# Patient Record
Sex: Female | Born: 1970
Health system: Southern US, Community
[De-identification: ages and names within clinical notes are randomized; demographics above are authoritative.]

## PROBLEM LIST (undated history)

## (undated) DIAGNOSIS — F329 Major depressive disorder, single episode, unspecified: Secondary | ICD-10-CM

## (undated) DIAGNOSIS — K2970 Gastritis, unspecified, without bleeding: Secondary | ICD-10-CM

## (undated) DIAGNOSIS — R011 Cardiac murmur, unspecified: Secondary | ICD-10-CM

## (undated) DIAGNOSIS — M722 Plantar fascial fibromatosis: Secondary | ICD-10-CM

## (undated) DIAGNOSIS — F410 Panic disorder [episodic paroxysmal anxiety] without agoraphobia: Secondary | ICD-10-CM

## (undated) DIAGNOSIS — G4733 Obstructive sleep apnea (adult) (pediatric): Secondary | ICD-10-CM

## (undated) DIAGNOSIS — N6019 Diffuse cystic mastopathy of unspecified breast: Secondary | ICD-10-CM

## (undated) DIAGNOSIS — K589 Irritable bowel syndrome without diarrhea: Secondary | ICD-10-CM

## (undated) DIAGNOSIS — E785 Hyperlipidemia, unspecified: Secondary | ICD-10-CM

## (undated) DIAGNOSIS — K219 Gastro-esophageal reflux disease without esophagitis: Secondary | ICD-10-CM

## (undated) DIAGNOSIS — I890 Lymphedema, not elsewhere classified: Secondary | ICD-10-CM

## (undated) DIAGNOSIS — R51 Headache: Secondary | ICD-10-CM

## (undated) DIAGNOSIS — F32A Depression, unspecified: Secondary | ICD-10-CM

## (undated) DIAGNOSIS — G43909 Migraine, unspecified, not intractable, without status migrainosus: Secondary | ICD-10-CM

## (undated) DIAGNOSIS — M797 Fibromyalgia: Secondary | ICD-10-CM

## (undated) DIAGNOSIS — F419 Anxiety disorder, unspecified: Secondary | ICD-10-CM

## (undated) DIAGNOSIS — M7730 Calcaneal spur, unspecified foot: Secondary | ICD-10-CM

## (undated) DIAGNOSIS — G2581 Restless legs syndrome: Secondary | ICD-10-CM

## (undated) DIAGNOSIS — J302 Other seasonal allergic rhinitis: Secondary | ICD-10-CM

## (undated) DIAGNOSIS — C50919 Malignant neoplasm of unspecified site of unspecified female breast: Secondary | ICD-10-CM

## (undated) DIAGNOSIS — F319 Bipolar disorder, unspecified: Secondary | ICD-10-CM

## (undated) HISTORY — DX: Fibromyalgia: M79.7

## (undated) HISTORY — DX: Headache: R51

## (undated) HISTORY — PX: OOPHORECTOMY: SHX86

## (undated) HISTORY — DX: Anxiety disorder, unspecified: F41.9

## (undated) HISTORY — DX: Gastritis, unspecified, without bleeding: K29.70

## (undated) HISTORY — DX: Migraine, unspecified, not intractable, without status migrainosus: G43.909

## (undated) HISTORY — DX: Other seasonal allergic rhinitis: J30.2

## (undated) HISTORY — DX: Obstructive sleep apnea (adult) (pediatric): G47.33

## (undated) HISTORY — DX: Depression, unspecified: F32.A

## (undated) HISTORY — DX: Hyperlipidemia, unspecified: E78.5

## (undated) HISTORY — DX: Diffuse cystic mastopathy of unspecified breast: N60.19

## (undated) HISTORY — DX: Calcaneal spur, unspecified foot: M77.30

## (undated) HISTORY — PX: MASTECTOMY: SHX3

## (undated) HISTORY — DX: Panic disorder (episodic paroxysmal anxiety): F41.0

## (undated) HISTORY — PX: BREAST LUMPECTOMY: SHX2

## (undated) HISTORY — DX: Restless legs syndrome: G25.81

## (undated) HISTORY — DX: Lymphedema, not elsewhere classified: I89.0

## (undated) HISTORY — DX: Irritable bowel syndrome, unspecified: K58.9

## (undated) HISTORY — PX: TONSILLECTOMY: SUR1361

## (undated) HISTORY — DX: Malignant neoplasm of unspecified site of unspecified female breast: C50.919

## (undated) HISTORY — DX: Bipolar disorder, unspecified: F31.9

## (undated) HISTORY — DX: Plantar fascial fibromatosis: M72.2

## (undated) HISTORY — DX: Major depressive disorder, single episode, unspecified: F32.9

## (undated) HISTORY — PX: KNEE SURGERY: SHX244

## (undated) HISTORY — DX: Cardiac murmur, unspecified: R01.1

## (undated) HISTORY — DX: Gastro-esophageal reflux disease without esophagitis: K21.9

---

## 2001-11-19 DIAGNOSIS — C50911 Malignant neoplasm of unspecified site of right female breast: Secondary | ICD-10-CM | POA: Insufficient documentation

## 2001-12-09 ENCOUNTER — Other Ambulatory Visit: Admission: RE | Admit: 2001-12-09 | Discharge: 2001-12-09 | Payer: Self-pay | Admitting: Vascular Surgery

## 2003-05-13 HISTORY — PX: LAPAROSCOPY: SHX197

## 2003-05-13 HISTORY — PX: ABDOMINAL HYSTERECTOMY: SHX81

## 2004-05-17 ENCOUNTER — Ambulatory Visit: Payer: Self-pay | Admitting: Oncology

## 2004-08-30 ENCOUNTER — Ambulatory Visit: Payer: Self-pay | Admitting: Oncology

## 2004-12-20 ENCOUNTER — Ambulatory Visit: Payer: Self-pay | Admitting: Oncology

## 2005-04-10 ENCOUNTER — Ambulatory Visit: Payer: Self-pay | Admitting: Oncology

## 2005-07-04 ENCOUNTER — Ambulatory Visit: Payer: Self-pay | Admitting: Oncology

## 2005-09-12 ENCOUNTER — Ambulatory Visit: Payer: Self-pay | Admitting: Oncology

## 2006-02-27 ENCOUNTER — Ambulatory Visit: Payer: Self-pay | Admitting: Oncology

## 2006-09-03 ENCOUNTER — Ambulatory Visit: Payer: Self-pay | Admitting: Oncology

## 2007-09-02 ENCOUNTER — Encounter: Payer: Self-pay | Admitting: Internal Medicine

## 2007-12-30 ENCOUNTER — Encounter (INDEPENDENT_AMBULATORY_CARE_PROVIDER_SITE_OTHER): Payer: Self-pay | Admitting: *Deleted

## 2008-01-26 ENCOUNTER — Encounter: Payer: Self-pay | Admitting: Internal Medicine

## 2008-03-15 ENCOUNTER — Ambulatory Visit: Payer: Self-pay | Admitting: Internal Medicine

## 2008-03-15 DIAGNOSIS — N390 Urinary tract infection, site not specified: Secondary | ICD-10-CM | POA: Insufficient documentation

## 2008-03-15 DIAGNOSIS — Z8659 Personal history of other mental and behavioral disorders: Secondary | ICD-10-CM | POA: Insufficient documentation

## 2008-03-15 DIAGNOSIS — F329 Major depressive disorder, single episode, unspecified: Secondary | ICD-10-CM | POA: Insufficient documentation

## 2008-03-15 DIAGNOSIS — F410 Panic disorder [episodic paroxysmal anxiety] without agoraphobia: Secondary | ICD-10-CM | POA: Insufficient documentation

## 2008-03-15 DIAGNOSIS — G43909 Migraine, unspecified, not intractable, without status migrainosus: Secondary | ICD-10-CM | POA: Insufficient documentation

## 2008-03-15 DIAGNOSIS — G4730 Sleep apnea, unspecified: Secondary | ICD-10-CM | POA: Insufficient documentation

## 2008-03-15 DIAGNOSIS — M722 Plantar fascial fibromatosis: Secondary | ICD-10-CM

## 2008-03-15 DIAGNOSIS — Z8679 Personal history of other diseases of the circulatory system: Secondary | ICD-10-CM | POA: Insufficient documentation

## 2008-03-15 DIAGNOSIS — R079 Chest pain, unspecified: Secondary | ICD-10-CM | POA: Insufficient documentation

## 2008-03-15 DIAGNOSIS — Z8669 Personal history of other diseases of the nervous system and sense organs: Secondary | ICD-10-CM | POA: Insufficient documentation

## 2008-03-15 DIAGNOSIS — Z8719 Personal history of other diseases of the digestive system: Secondary | ICD-10-CM | POA: Insufficient documentation

## 2008-03-15 DIAGNOSIS — J45909 Unspecified asthma, uncomplicated: Secondary | ICD-10-CM | POA: Insufficient documentation

## 2008-03-15 DIAGNOSIS — K219 Gastro-esophageal reflux disease without esophagitis: Secondary | ICD-10-CM

## 2008-03-15 DIAGNOSIS — F319 Bipolar disorder, unspecified: Secondary | ICD-10-CM | POA: Insufficient documentation

## 2008-03-15 DIAGNOSIS — E785 Hyperlipidemia, unspecified: Secondary | ICD-10-CM

## 2008-03-15 DIAGNOSIS — M7730 Calcaneal spur, unspecified foot: Secondary | ICD-10-CM

## 2008-03-15 DIAGNOSIS — I890 Lymphedema, not elsewhere classified: Secondary | ICD-10-CM | POA: Insufficient documentation

## 2008-03-15 DIAGNOSIS — Z87898 Personal history of other specified conditions: Secondary | ICD-10-CM | POA: Insufficient documentation

## 2008-03-15 LAB — CONVERTED CEMR LAB
Protein, U semiquant: NEGATIVE
RBC / HPF: NONE SEEN (ref <3)
Specific Gravity, Urine: 1.025
Urobilinogen, UA: 0.2

## 2008-03-16 ENCOUNTER — Encounter: Payer: Self-pay | Admitting: Internal Medicine

## 2008-03-17 ENCOUNTER — Telehealth (INDEPENDENT_AMBULATORY_CARE_PROVIDER_SITE_OTHER): Payer: Self-pay | Admitting: *Deleted

## 2008-03-17 LAB — CONVERTED CEMR LAB
ALT: 14 units/L (ref 0–35)
Basophils Absolute: 0 10*3/uL (ref 0.0–0.1)
Bilirubin, Direct: 0.1 mg/dL (ref 0.0–0.3)
CO2: 32 meq/L (ref 19–32)
Calcium: 9.6 mg/dL (ref 8.4–10.5)
Hemoglobin: 12.5 g/dL (ref 12.0–15.0)
Lymphocytes Relative: 42.9 % (ref 12.0–46.0)
MCHC: 33.9 g/dL (ref 30.0–36.0)
Neutro Abs: 3.5 10*3/uL (ref 1.4–7.7)
Neutrophils Relative %: 49.1 % (ref 43.0–77.0)
RDW: 13 % (ref 11.5–14.6)
Saturation Ratios: 11.1 % — ABNORMAL LOW (ref 20.0–50.0)
Sodium: 147 meq/L — ABNORMAL HIGH (ref 135–145)
Total Bilirubin: 0.6 mg/dL (ref 0.3–1.2)
Transferrin: 283.3 mg/dL (ref >=212.0)

## 2008-03-20 ENCOUNTER — Ambulatory Visit: Payer: Self-pay | Admitting: Gastroenterology

## 2008-03-20 DIAGNOSIS — K589 Irritable bowel syndrome without diarrhea: Secondary | ICD-10-CM

## 2008-03-20 DIAGNOSIS — R1310 Dysphagia, unspecified: Secondary | ICD-10-CM | POA: Insufficient documentation

## 2008-03-23 ENCOUNTER — Encounter: Payer: Self-pay | Admitting: Gastroenterology

## 2008-03-23 ENCOUNTER — Telehealth: Payer: Self-pay | Admitting: Gastroenterology

## 2008-03-24 ENCOUNTER — Ambulatory Visit: Payer: Self-pay | Admitting: Cardiology

## 2008-04-03 ENCOUNTER — Telehealth: Payer: Self-pay | Admitting: Gastroenterology

## 2008-04-07 ENCOUNTER — Telehealth: Payer: Self-pay | Admitting: Gastroenterology

## 2008-04-10 ENCOUNTER — Telehealth (INDEPENDENT_AMBULATORY_CARE_PROVIDER_SITE_OTHER): Payer: Self-pay | Admitting: *Deleted

## 2009-10-23 ENCOUNTER — Encounter: Payer: Self-pay | Admitting: Internal Medicine

## 2010-04-05 ENCOUNTER — Ambulatory Visit: Payer: Self-pay | Admitting: Cardiology

## 2010-06-11 NOTE — Assessment & Plan Note (Signed)
Summary: 40yr f/u chest pain pt refused appt. 03-29-10/sl   Visit Type:  Follow-up Primary Provider:  Vira Contreras  CC:  chest pain.  History of Present Illness: The presents with a 2 year followup. Since I last saw her she continues to have some chest discomfort. This sounds like chronic problem and she can't tell me whether it is worse. She feels it sporadically. The most recent episode was right upper chest. It happens at rest. She is not particularly active but doesn't notice it coming on with activity. There is no associated nausea vomiting or diaphoresis. She does have shortness of breath she says with mild to moderate activity. She is not describing PND or orthopnea. She is not describing palpitations, presyncope or syncope. As per my 2009 note she reports multiple stress tests in the past but none in the last few years.   Current Medications (verified): 1)  Crestor 20 Mg Tabs (Rosuvastatin Calcium) .... One Daily 2)  Nexium 40 Mg Cpdr (Esomeprazole Magnesium) .... Two Times A Day 3)  Promethazine Hcl 25 Mg Tabs (Promethazine Hcl) .... Prn 4)  Fexofenadine Hcl 180 Mg Tabs (Fexofenadine Hcl) .... Once Daily 5)  Xopenex 1.25 Mg/9ml Nebu (Levalbuterol Hcl) .... 1/2 Qid As Needed 6)  Proventil Hfa 108 (90 Base) Mcg/act Aers (Albuterol Sulfate) .... 2 Puffs As Needed 7)  Gabapentin 600 Mg Tabs (Gabapentin) .... 2 Tablets 4 Times A Day 8)  Tramadol-Acetaminophen 37.5-325 Mg Tabs (Tramadol-Acetaminophen) .... One Tab By Mouth As Needed 9)  Cymbalta 60 Mg Cpep (Duloxetine Hcl) .... Daily 10)  Qvar 80 Mcg/act Aers (Beclomethasone Dipropionate) .... 2 Inhalations Twice A Day 11)  Azelastine Hcl 137 Mcg/spray Soln (Azelastine Hcl) .... 2 Sprays [place] Twice A Day 12)  Lidocaine Hcl 4 % Soln (Lidocaine Hcl) .... As Needed For Migraine 13)  Pramipexole Dihydrochloride 0.125 Mg Tabs (Pramipexole Dihydrochloride) .... One A Bedtime  Allergies (verified): 1)  ! Penicillin 2)  ! Codeine 3)  !  Sulfa 4)  ! Oxycodone Hcl 5)  ! Diazepam 6)  ! * Norethindrone 7)  ! Hydrocodone 8)  ! Lortab 9)  ! * Topamax 10)  ! Requip 11)  ! Avelox 12)  ! Vicodin 13)  ! Levaquin  Past History:  Past Medical History: BREAST CANCER  2003  LYMPHEDEMA, RIGHT ARM Bipolar D/O Dx aprox 2006 HYPERLIPIDEMIA   GERD   ASTHMA   OSA-- has a CPAP RLS IBS PLANTAR FASCIITIS, RIGHT, and  HEEL SPUR  FIBROCYSTIC BREAST DISEASE, HX OF (ICD-V13.9) ANXIETY DISORDER , PANICK ATTACKS and DEPRESSION  MIGRAINE HEADACHE (ICD-346.90) HEART MURMUR, HX OF (ICD-V12.50)     Past Surgical History: Knee surgery (1610 & 1990) Tonsillectomy (1991) Lumpectomy (12/06/2001) (rt breast) Mastectomy (12/27/2001) (rt breast) Lumpectomy (08/08/2002) (left breast) Hysterectomy (03/04/2004) Oophorectomy (03/04/2004) Laparoscope  Review of Systems       As stated in the HPI and negative for all other systems.   Vital Signs:  Patient profile:   40 year old female Height:      61 inches Weight:      205 pounds BMI:     38.87 BP sitting:   130 / 80  (left arm) Cuff size:   large  Vitals Entered By: Deliah Goody, RN (April 05, 2010 11:27 AM)  Physical Exam  General:  Well developed, well nourished, in no acute distress. Head:  normocephalic and atraumatic Eyes:  PERRLA/EOM intact; conjunctiva and lids normal. Neck:  Neck supple, no JVD. No masses, thyromegaly or abnormal  cervical nodes. Chest Wall:  CAD status post right mastectomy Lungs:  Clear bilaterally to auscultation and percussion. Abdomen:  soft, no distention, no masses, no guarding, and no rigidity.  mild generalize tenderness Msk:  Back normal, normal gait. Muscle strength and tone normal. Extremities:  no pretibial edema bilaterally  Neurologic:  Alert and oriented x 3. Skin:  Intact without lesions or rashes. Cervical Nodes:  no significant adenopathy Inguinal Nodes:  no significant adenopathy Psych:  Normal affect.   Detailed  Cardiovascular Exam  Neck    Carotids: Carotids full and equal bilaterally without bruits.      Neck Veins: Normal, no JVD.    Heart    Inspection: no deformities or lifts noted.      Palpation: normal PMI with no thrills palpable.      Auscultation: regular rate and rhythm, S1, S2 without murmurs, rubs, gallops, or clicks.    Vascular    Abdominal Aorta: no palpable masses, pulsations, or audible bruits.      Femoral Pulses: normal femoral pulses bilaterally.      Pedal Pulses: normal pedal pulses bilaterally.      Radial Pulses: normal radial pulses bilaterally.      Peripheral Circulation: no clubbing, cyanosis, or edema noted with normal capillary refill.     Impression & Recommendations:  Problem # 1:  CHEST PAIN (ICD-786.50) She has atypical symptoms. I think the pretest probability of obstructive coronary disease is somewhat low. Exercise treadmill testing is reasonable as screening for this patient. Orders: EKG w/ Interpretation (93000) Treadmill (Treadmill)  Problem # 2:  HYPERLIPIDEMIA (ICD-272.4) She is having is followed closely by her primary physician. She reports some muscle cramping with Crestor.  Problem # 3:  HEART MURMUR, HX OF (ICD-V12.50) I do not appreciate a heart murmur and no further evaluation is suggested.  Problem # 4:  OBESITY, UNSPECIFIED (ICD-278.00) Her BMI puts her in an obese range.  She needs weight loss with diet and exercise and we discussed this.  Patient Instructions: 1)  Your physician recommends that you schedule a follow-up appointment at the time of your treadmill 2)  Your physician recommends that you continue on your current medications as directed. Please refer to the Current Medication list given to you today. 3)  Your physician has requested that you have an exercise tolerance test.  For further information please visit [email or web address]  Please also follow instruction sheet, as given.

## 2010-06-11 NOTE — Letter (Signed)
Summary: [facility]  [facility]   Imported By: Lanelle Bal 11/14/2009 14:59:22  _____________________________________________________________________  External Attachment:    Type:   Image     Comment:   External Document

## 2010-09-16 ENCOUNTER — Encounter: Payer: Self-pay | Admitting: Physician Assistant

## 2010-09-24 NOTE — Assessment & Plan Note (Signed)
[facility]                            CARDIOLOGY OFFICE NOTE   Kingsley Plan [name]                     MRN:          [id number]  DATE:03/24/2008                            DOB:          Aug 22, 1970    PRIMARY:  [name], MD   REASON FOR CONSULTATION:  Evaluated the patient with chest pain.   HISTORY OF PRESENT ILLNESS:  The patient is a pleasant 40 year old white  female with a long history of chest discomfort.  She says she has had  this for years.  She has had previous cardiac workups to include stress  testing x2.  She has also had echocardiograms.  She has had monitors.  All of this had been done in [place].  She thinks the last stress test  was about 2-3 years ago.  She does describe an adenosine Cardiolite.  Apparently, this was all negative.  She is seeing Dr. Drue Novel now for  evaluation of multiple complaints.  Among these is the chest discomfort.  It has been more intense recently.  She describes a tightness.  It can  happen at rest.  It can happen with walking.  She notes if she walks a  hill close to home she will get this discomfort.  She can only make it  about half way where she has to stop.  The pain can be 5/10 in  intensity.  It goes away with rest.  She may have this at rest as well.  She gets nauseated.  She says it is the same pain with the same  intensity and perhaps more frequent.  She has had this discomfort for  years and this is why she has had the previous workups.  She  occasionally has palpitations.  She has some lymphedema in her right arm  and some chronic discomfort there.  She has occasional palpitations at  night.  She has no presyncope or syncope.  She does not describe any PND  or orthopnea.  She said she does have wheezing, she says she also have  sleep apnea and uses a CPAP at night.   PAST MEDICAL HISTORY:  Hyperlipidemia, irritable bowel syndrome, sleep  apnea, bipolar disorder (she says she came off her  medicines and she is  going to church to try to treat this), reflux, asthma, plantar  fasciitis, heel spur, fibrocystic breast disease, lymphedema, panic  attack/anxiety disorder, restless leg syndrome, migraine headaches.   PAST SURGICAL HISTORY:  Knee surgery, tonsillectomy, lumpectomy,  mastectomy, hysterectomy.   ALLERGIES INTOLERANCES:  PENICILLIN, CODEINE, SULFA, OXYCODONE,  DIAZEPAM, NORETHINDRONE, HYDROCODONE, LORTAB, TOPAMAX, REQUIP, AVELOX,  VICODIN, and LEVAQUIN.   MEDICATIONS:  1. Detrol LA 4 mg daily.  2. Crestor 20 mg daily.  3. Darvocet.  4. Rhinocort.  5. Nexium 40 mg two times a day.  6. Fish oil 3  times a day.  7. Viactiv 500 mg 2 times a day.  8. Promethazine as needed.  9. Foradil aerolizer.  10.Allegra.  11.Xopenex.  12.Proventil.  13.Diphenoxylate.  14.Imitrex.  15.Gabapentin.  16.Verapamil 120 mg daily.  17.Cysteine.  18.Robinul Forte.   SOCIAL  HISTORY:  The patient is disabled.  She is not married.   FAMILY HISTORY:  Somewhat vague.  She knows her dad is alive at 68 and  had stents and an open heart surgery.  She does not know at what age.  Says she has a brother with congestive heart failure at age 33.   REVIEW OF SYSTEMS:  With multiple positive findings including headaches,  dizziness, tinnitus, dyspnea, chest pain, palpitations, questionable  hemoptysis, asthma, constipation, nausea, urinary frequency, swelling in  her whole body, pain from a whiplash, rhinitis.  Otherwise negative for  other systems.   PHYSICAL EXAMINATION:  VITAL SIGNS:  The patient is in no distress.  Blood pressure 124/86, heart 76 and regular, weight 203 pounds, body  mass index 38.  HEENT:  Eyelids unremarkable, pupils equal, round, and reactive to  light, fundi not visualized, oral mucosa unremarkable.  NECK:  No jugular venous distention at 45 degrees, carotid upstroke  brisk and symmetric, no bruits, no thyromegaly.  LYMPHATICS:  No cervical, axillary, inguinal  adenopathy.  LUNGS:  Clear to auscultation bilaterally.  BACK:  No costovertebral angle tenderness.  CHEST:  Unremarkable.  HEART:  PMI not displaced or sustained, S1 and S2 within normal limits,  no S3, no S4, no clicks, no rubs, no murmurs.  ABDOMEN:  Obese, positive bowel sounds normal in frequency and pitch, no  bruits, no rebound, no guarding, or midline pulsatile mass, no  hepatomegaly, no splenomegaly.  SKIN:  No rashes, no nodules.  EXTREMITIES:  2+ pulses throughout, no edema, no cyanosis, or clubbing.  NEURO:  Oriented to person, place, and time.  Cranial nerves II through  XII are grossly intact, motor grossly intact.   EKG sinus rhythm, rate 76, axis within normal limits, intervals within  normal limits, no acute ST-T wave changes.   ASSESSMENT AND PLAN:  1. Chest.  The patient's chest comfort is chronic.  She has had an      extensive workup apparently for the same symptoms.  At this point,      I would not strongly suspect obstructive coronary disease.  Her      physical exam does not suggest this.  Her EKG does not suggest      this.  Her symptoms are atypical predominately and again unchanged      from previous workups.  I do think she has probable      gastrointestinal complaints and she is due to have an EGD with Dr.      Arlyce Dice in the next couple of weeks.  I would pursue this.  I would      like to review the old cardiac workup.  Dr. Drue Novel has requested these      charts and I have requested them from him.  If she continues to      have complaints and there is no clear GI etiology, I would be happy      to see her back to consider further cardiac testing.  In the      meantime, she needs primary risk reduction.  2. Obesity.  She understands the need to lose weight with diet and      exercise.  3. Bipolar disorder.  She has come off of her medicines apparently and      I have encouraged her to follow with her doctors as this is unsafe.      She currently however is  not depressed or particularly manic at  this appointment.  4. Asthma per her primary care doctors.  She remains on medicines as      listed.  5. Dyslipidemia.  I have given her written note to get a lipid profile      and liver enzymes done.  I would be happy to review this.     Rollene Rotunda, MD, [facility]  Electronically Signed    JH/MedQ  DD: 03/24/2008  DT: 03/25/2008  Job #: [id number]   cc:   [name], MD

## 2010-09-27 ENCOUNTER — Telehealth: Payer: Self-pay | Admitting: Cardiology

## 2010-09-27 NOTE — Telephone Encounter (Signed)
Pt wants to set up stress test--nt

## 2010-09-27 NOTE — Telephone Encounter (Signed)
Per pt calling, wants to set up stress test.

## 2010-09-29 NOTE — Telephone Encounter (Signed)
Ok to set up ETT with me or with [employer].

## 2010-10-01 ENCOUNTER — Encounter: Payer: Self-pay | Admitting: Physician Assistant

## 2010-10-01 ENCOUNTER — Encounter: Payer: Self-pay | Admitting: *Deleted

## 2010-10-02 ENCOUNTER — Encounter: Payer: Self-pay | Admitting: Physician Assistant

## 2010-10-02 ENCOUNTER — Ambulatory Visit (INDEPENDENT_AMBULATORY_CARE_PROVIDER_SITE_OTHER): Payer: Medicare Other | Admitting: Physician Assistant

## 2010-10-02 VITALS — BP 122/82 | HR 81 | Ht 61.0 in | Wt 198.4 lb

## 2010-10-02 DIAGNOSIS — R079 Chest pain, unspecified: Secondary | ICD-10-CM

## 2010-10-02 DIAGNOSIS — R0602 Shortness of breath: Secondary | ICD-10-CM

## 2010-10-02 NOTE — Progress Notes (Signed)
History of Present Illness: Primary Cardiologist:  Dr. Rollene Rotunda  Danielle Contreras is a 40 y.o. female with a h/o some chest discomfort.  In review of her chart, she reports multiple stress tests in the past but none in the last few years.  She saw Dr. Antoine Poche in 03/2010 and he recommended she undergo an ETT.  She canceled this due to knee pain but never rescheduled a Myoview.    She presents today with complaints of chest discomfort.  She's been evaluated for this in the past.  This is a tightness in her left chest that seems worse over the last one to 2 months.  She had a right mastectomy several years ago.  She had a mammogram recently that was somewhat painful.  She also had an MRI done of her left breast.  She developed worsening chest symptoms after this.  She does note dyspnea with exertion.  She really describes NYHA class II symptoms.  She denies orthopnea.  She denies edema.  She does sleep with a CPAP for her sleep apnea.  She saw her allergy and asthma doctor recently and he felt that she probably had musculoskeletal chest pain.  She had recent lab work done with her PCP which demonstrated hypokalemia with a potassium of 3.3.  LFTs were normal except for an alkaline phosphatase of 148. Her hemoglobin was normal at 13.6.  She has chest discomfort at times when she walks that is associated with shortness of breath.  She also notes chest discomfort if she changes positioning or moves her left arm.  She can experience the symptoms at rest.  Actually, her chest symptoms have been fairly constant over the last month or 2.  It seems to be worse recently.  She does feel lightheaded at times.  She denies syncope.  She also notes a hard heartbeat at times but no tachy-palpitations.  She denies any recent long trips.  Her asthma seems to be well-controlled.  She denies significant wheezing or cough.  She does take her proton pump inhibitor when she remembers it.  Past Medical History  Diagnosis Date    . Breast cancer   . Lymphedema   . Bipolar 1 disorder   . Hyperlipidemia   . GERD (gastroesophageal reflux disease)   . Asthma   . OSA (obstructive sleep apnea)   . RLS (restless legs syndrome)   . IBS (irritable bowel syndrome)   . Plantar fasciitis   . Breast fibrocystic disorder   . Headache   . Heart murmur     Current Outpatient Prescriptions  Medication Sig Dispense Refill  . albuterol (PROVENTIL) (5 MG/ML) 0.5% nebulizer solution Take 2.5 mg by nebulization every 6 (six) hours as needed.        Marland Kitchen albuterol (PROVENTIL,VENTOLIN) 90 MCG/ACT inhaler Inhale 2 puffs into the lungs every 6 (six) hours as needed.        Marland Kitchen azelastine (ASTELIN) 137 MCG/SPRAY nasal spray 1 spray by Nasal route 2 (two) times daily. Use in each nostril as directed       . beclomethasone (QVAR) 80 MCG/ACT inhaler Inhale 1 puff into the lungs as needed.        . DULoxetine (CYMBALTA) 60 MG capsule Take 60 mg by mouth daily.        Marland Kitchen esomeprazole (NEXIUM) 40 MG capsule Take 40 mg by mouth 2 (two) times daily.        . fexofenadine (ALLEGRA) 180 MG tablet Take 180 mg by mouth  daily.        . gabapentin (NEURONTIN) 600 MG tablet 2 tabs po 4 times per day       . pramipexole (MIRAPEX) 0.125 MG tablet Take 0.125 mg by mouth at bedtime.        . promethazine (PHENERGAN) 25 MG tablet Take 25 mg by mouth every 6 (six) hours as needed.        . rosuvastatin (CRESTOR) 20 MG tablet Take 20 mg by mouth daily.        . traMADol (ULTRAM) 50 MG tablet Take 50 mg by mouth every 6 (six) hours as needed.        Marland Kitchen DISCONTD: esomeprazole (NEXIUM) 40 MG capsule Take 40 mg by mouth daily before breakfast.        . DISCONTD: levalbuterol (XOPENEX) 1.25 MG/0.5ML nebulizer solution Take 1 ampule by nebulization every 4 (four) hours as needed.          Allergies: Allergies  Allergen Reactions  . Avelox (Moxifloxacin Hcl In Nacl)   . Codeine   . Diazepam   . Hydrocodone   . Hydrocodone-Acetaminophen   . Levaquin   .  Levofloxacin   . Moxifloxacin   . Oxycodone Hcl   . Penicillins   . Ropinirole Hydrochloride   . Sulfonamide Derivatives   . Topiramate    History  Substance Use Topics  . Smoking status: Never Smoker   . Smokeless tobacco: Not on file  . Alcohol Use: No    Family History  Problem Relation Age of Onset  . Coronary artery disease    . Stroke    . Diabetes    . Hypertension    . Colon cancer    . Breast cancer    . Diabetes Father   . Heart failure Brother     ROS:  See history of present illness.  She reports frequent migraines.  She denies fevers, chills, cough, melena, hematochezia.  All her systems reviewed and negative.  Vital Signs: BP 144/112  Pulse 66  Ht 5\' 1"  (1.549 m)  Wt 198 lb 6.4 oz (89.994 kg)  BMI 37.49 kg/m2  PHYSICAL EXAM: Well nourished, well developed, in no acute distress HEENT: normal Neck: no JVD Vascular: No carotid bruits Endocrine: No thyromegaly Cardiac:  normal S1, S2; RRR; no murmur Lungs:  clear to auscultation bilaterally, no wheezing, rhonchi or rales Abd: soft, nontender, no hepatomegaly Ext: no edema Skin: warm and dry Neuro:  CNs 2-12 intact, no focal abnormalities noted Psych: Somewhat tearful at times  EKG:  Sinus rhythm, heart rate 66, normal axis, nonspecific ST-T wave changes  ASSESSMENT AND PLAN:

## 2010-10-02 NOTE — Assessment & Plan Note (Addendum)
Etiology of her symptoms are somewhat unclear.  She never did have her stress test.  I recommended that she proceed with a Lexiscan Myoview to rule out ischemic heart disease.  I've also recommended that she proceed with a 2-D echocardiogram to further assess her shortness of breath.  She did have chemotherapy for breast cancer.  I will also add a BNP to blood work that she has scheduled with her primary care provider.  This will also include a TSH.  We'll bring her back in follow up with Dr. Antoine Poche in 2 weeks.  Other etiologies for her symptoms include musculoskeletal, gastrointestinal.  I have asked her to take her Nexium every day.    Of note, her BP was hard to hear.  We checked orthostatics and her pressures were ok.  I repeated it and it was 132 systolic.  This is consistent with what she gets at her PCP's office.    We could consider a holter for her palpitations.  But, proceed with an echo and myoview for now with close follow up.

## 2010-10-02 NOTE — Patient Instructions (Addendum)
Your physician has requested that you have a lexiscan Myoview 786.50, 786.05 CP AND SOB. For further information please visit [email or web address] Please follow instruction sheet, as given.  Your physician has requested that you have an echocardiogram 786.05, 786.50. Echocardiography is a painless test that uses sound waves to create images of your heart. It provides your doctor with information about the size and shape of your heart and how well your heart's chambers and valves are working. This procedure takes approximately one hour. There are no restrictions for this procedure.  A PRESCRIPTION HAS BEEN GIVEN TO YOU TODAY TO HAVE LAB WORK DONE WITH [facility] FOR A BNP 786.50, 786.05 CP AND SOB   Your physician recommends that you schedule a follow-up appointment in: 2 WEEKS WITH DR. HOCHREIN, IF NOT AVAILABLE THEN TO SCHEDULE WITH SCOTT WEAVER, PA DAY [facility] IS IN THE OFFICE

## 2010-10-04 ENCOUNTER — Encounter: Payer: Self-pay | Admitting: Physician Assistant

## 2010-10-15 ENCOUNTER — Other Ambulatory Visit (HOSPITAL_COMMUNITY): Payer: Medicare Other | Admitting: Radiology

## 2010-10-16 ENCOUNTER — Ambulatory Visit (HOSPITAL_COMMUNITY): Payer: Medicare Other | Attending: Cardiology | Admitting: Radiology

## 2010-10-16 ENCOUNTER — Ambulatory Visit (INDEPENDENT_AMBULATORY_CARE_PROVIDER_SITE_OTHER): Payer: Medicare Other | Admitting: Physician Assistant

## 2010-10-16 ENCOUNTER — Ambulatory Visit (HOSPITAL_BASED_OUTPATIENT_CLINIC_OR_DEPARTMENT_OTHER): Payer: Medicare Other | Admitting: Radiology

## 2010-10-16 ENCOUNTER — Encounter: Payer: Self-pay | Admitting: Physician Assistant

## 2010-10-16 VITALS — BP 126/82 | HR 71 | Ht 61.0 in | Wt 195.0 lb

## 2010-10-16 DIAGNOSIS — R002 Palpitations: Secondary | ICD-10-CM | POA: Insufficient documentation

## 2010-10-16 DIAGNOSIS — K219 Gastro-esophageal reflux disease without esophagitis: Secondary | ICD-10-CM

## 2010-10-16 DIAGNOSIS — R0602 Shortness of breath: Secondary | ICD-10-CM

## 2010-10-16 DIAGNOSIS — R0789 Other chest pain: Secondary | ICD-10-CM

## 2010-10-16 DIAGNOSIS — R079 Chest pain, unspecified: Secondary | ICD-10-CM

## 2010-10-16 DIAGNOSIS — R072 Precordial pain: Secondary | ICD-10-CM

## 2010-10-16 DIAGNOSIS — J45909 Unspecified asthma, uncomplicated: Secondary | ICD-10-CM

## 2010-10-16 MED ORDER — TECHNETIUM TC 99M TETROFOSMIN IV KIT
33.0000 | PACK | Freq: Once | INTRAVENOUS | Status: AC | PRN
  Administered 2010-10-16: 33 via INTRAVENOUS

## 2010-10-16 MED ORDER — REGADENOSON 0.4 MG/5ML IV SOLN
0.4000 mg | Freq: Once | INTRAVENOUS | Status: AC
  Administered 2010-10-16: 0.4 mg via INTRAVENOUS

## 2010-10-16 MED ORDER — TECHNETIUM TC 99M TETROFOSMIN IV KIT
11.0000 | PACK | Freq: Once | INTRAVENOUS | Status: AC | PRN
  Administered 2010-10-16: 11 via INTRAVENOUS

## 2010-10-16 NOTE — Assessment & Plan Note (Signed)
Continue Nexium.  Follow up with gastroenterology.

## 2010-10-16 NOTE — Assessment & Plan Note (Signed)
Continue current medicines and follow up with pulmonology.

## 2010-10-16 NOTE — Assessment & Plan Note (Signed)
With a normal BNP, echocardiogram and Myoview, this appears to be noncardiac.  I suspect she has a combination of musculoskeletal chest pain, asthma and acid reflux contributing to her symptoms.  She has seen her pulmonologist recently.  She has not seen gastroenterology in about 3 years.  I should note, her symptoms seem to have improved since he started taking Nexium on a daily basis.  She is taking this twice a day.  I have recommended that she follow up with gastroenterology.

## 2010-10-16 NOTE — Patient Instructions (Signed)
Your physician recommends that you schedule a follow-up appointment in: 2-3 MONTHS WITH DR. HOCHREIN AS PER SCOTT WEAVER, PA-C.  You have been referred to GASTROENTEROLOGY WITH DR. KAPLAN FOR GERD AS PER SCOTT WEAVER, PA-C.  Your physician has recommended that you wear an event monitor 785.1 PALPITATIONS. Event monitors are medical devices that record the heart's electrical activity. Doctors most often [place] these monitors to diagnose arrhythmias. Arrhythmias are problems with the speed or rhythm of the heartbeat. The monitor is a small, portable device. You can wear one while you do your normal daily activities. This is usually used to diagnose what is causing palpitations/syncope (passing out).

## 2010-10-16 NOTE — Progress Notes (Addendum)
[facility] SITE 3 NUCLEAR MED [address] [place] [address] [phone]  Cardiology Nuclear Med Study  Danielle Contreras is a 40 y.o. female [id number] January 07, 1971   Nuclear Med Background Indication for Stress Test:  Evaluation for Ischemia History: Stress Echo: > 17yrs ago: Ok per patient, History of Chemo, and Echo today Cardiac Risk Factors: Family History - CAD, Hypertension and Lipids  Symptoms:  Chest Pain, Chest Tightness with Exertion (last date of chest discomfort 2-3 days ago), DOE, Light-Headedness, Nausea, Near Syncope, Palpitations and Rapid HR   Nuclear Pre-Procedure Caffeine/Decaff Intake:  None NPO After: 1:00am   Lungs:  Clear IV 0.9% NS with Angio Cath:  24g  IV Site: L Hand  IV Started by:  Irean Hong, RN  Chest Size (in):  42 Cup Size: D  Height: 5\' 1"  (1.549 m)  Weight:  195 lb (88.451 kg)  BMI:  Body mass index is 36.84 kg/(m^2). Tech Comments:  N/A    Nuclear Med Study 1 or 2 day study: 1 day  Stress Test Type:  Treadmill/Lexiscan  Reading MD: Charlton Haws, MD  Order Authorizing Provider:  Rollene Rotunda, MD  Resting Radionuclide: Technetium 65m Tetrofosmin  Resting Radionuclide Dose: 11 mCi   Stress Radionuclide:  Technetium 49m Tetrofosmin  Stress Radionuclide Dose: 33 mCi           Stress Protocol Rest HR: 64 Stress HR: 150  Rest BP: 125/83 Stress BP: 154/72  Exercise Time (min): 2:00 METS: n/a   Predicted Max HR: 179 bpm % Max HR: 83.8 bpm Rate Pressure Product: 21308   Dose of Adenosine (mg):  n/a Dose of Lexiscan: 0.4 mg  Dose of Atropine (mg): n/a Dose of Dobutamine: n/a mcg/kg/min (at max HR)  Stress Test Technologist: Irean Hong, RN  Nuclear Technologist:  Doyne Keel, CNMT     Rest Procedure:  Myocardial perfusion imaging was performed at rest 45 minutes following the intravenous administration of Technetium 10m Tetrofosmin. Rest ECG: NSR with sinus arrhythmia  Stress Procedure:  The patient received  IV Lexiscan 0.4 mg over 15-seconds with concurrent low level exercise. Technetium 29m Tetrofosmin was injected at 30-seconds while the patient continued walking. There were nonspecific changes with Lexiscan.  Quantitative spect images were obtained after a 45-minute delay. Stress ECG: No significant change from baseline ECG  QPS Raw Data Images:  Normal; no motion artifact; normal heart/lung ratio. Stress Images:  Normal homogeneous uptake in all areas of the myocardium. Rest Images:  Normal homogeneous uptake in all areas of the myocardium. Subtraction (SDS):  Normal Transient Ischemic Dilatation (Normal <1.22):  0.87 Lung/Heart Ratio (Normal <0.45):  0.40  Quantitative Gated Spect Images QGS EDV:  62 ml QGS ESV:  10 ml QGS cine images:  NL LV Function; NL Wall Motion QGS EF: 84%  Impression Exercise Capacity:  Lexiscan with low level exercise. BP Response:  Normal blood pressure response. Clinical Symptoms:  No chest pain. ECG Impression:  No significant ST segment change suggestive of ischemia. Comparison with Prior Nuclear Study: No previous nuclear study performed  Overall Impression:  Normal stress nuclear study.   Charlton Haws  Negative stress perfusion study.  Rollene Rotunda

## 2010-10-16 NOTE — Progress Notes (Signed)
History of Present Illness: Primary Cardiologist:  Dr. Rollene Rotunda  Danielle Contreras is a 40 y.o. female with a h/o some chest discomfort.  In review of her chart, she reports multiple stress tests in the past but none in the last few years.  She saw Dr. Antoine Poche in 03/2010 and he recommended she undergo an ETT.  She canceled this due to knee pain but never rescheduled a Myoview.    I saw her 2 weeks ago with complaints of chest discomfort.  She has been evaluated for this in the past.  This was a tightness in her left chest that seemed worse over the last one to 2 months.  She had a right mastectomy several years ago and had had a mammogram recently that was somewhat painful.  She also had an MRI done of her left breast.  She developed worsening chest symptoms after this.  She also noted dyspnea with exertion.  She really describes NYHA class II symptoms.  She denied orthopnea.  She denied edema.  She does sleep with a CPAP for her sleep apnea.  She had seen her allergy and asthma doctor recently and he felt that she probably had musculoskeletal chest pain.  She reported chest discomfort at times when she walks that was associated with shortness of breath.  She also noted chest discomfort if she changed positioning or moved her left arm.  She noted the symptoms at rest.  Actually, her chest symptoms had been fairly constant over the last month or 2.  She noted some lightheadedness and some palpitations.  I had her go back on her PPI on a regular basis.  She was only taking it PRN.  I set her up for an echo and a myoview.  I just got the echo results.  Her EF is normal and she has normal diastolic function.  No valvular abnormalities.  She had a BNP done 2 weeks ago that was normal.  Her Myoview has not been read yet.  But, I reviewed the images with Dr. Antoine Poche.  There does not appear to be any ischemia.  Her chest symptoms are better.  She continues to have palpitations.  She continues to have  lightheadedness associated with this.  She denies syncope.  Past Medical History  Diagnosis Date  . Breast cancer   . Lymphedema   . Bipolar 1 disorder   . Hyperlipidemia   . GERD (gastroesophageal reflux disease)   . Asthma   . OSA (obstructive sleep apnea)   . RLS (restless legs syndrome)   . IBS (irritable bowel syndrome)   . Plantar fasciitis   . Breast fibrocystic disorder   . Headache   . Heart murmur     Current Outpatient Prescriptions  Medication Sig Dispense Refill  . albuterol (PROVENTIL) (5 MG/ML) 0.5% nebulizer solution Take 2.5 mg by nebulization every 6 (six) hours as needed.        Marland Kitchen albuterol (PROVENTIL,VENTOLIN) 90 MCG/ACT inhaler Inhale 2 puffs into the lungs every 6 (six) hours as needed.        Marland Kitchen azelastine (ASTELIN) 137 MCG/SPRAY nasal spray 1 spray by Nasal route 2 (two) times daily. Use in each nostril as directed       . beclomethasone (QVAR) 80 MCG/ACT inhaler Inhale 1 puff into the lungs as needed.        . DULoxetine (CYMBALTA) 60 MG capsule Take 60 mg by mouth daily.        Marland Kitchen esomeprazole (NEXIUM) 40  MG capsule Take 40 mg by mouth 2 (two) times daily.        . fexofenadine (ALLEGRA) 180 MG tablet Take 180 mg by mouth daily.        Marland Kitchen gabapentin (NEURONTIN) 600 MG tablet 2 tabs po 4 times per day       . pramipexole (MIRAPEX) 0.125 MG tablet Take 0.125 mg by mouth at bedtime.        . promethazine (PHENERGAN) 25 MG tablet Take 25 mg by mouth every 6 (six) hours as needed.        . rosuvastatin (CRESTOR) 20 MG tablet Take 20 mg by mouth daily.        . traMADol (ULTRAM) 50 MG tablet Take 50 mg by mouth every 6 (six) hours as needed.         No current facility-administered medications for this visit.   Facility-Administered Medications Ordered in Other Visits  Medication Dose Route Frequency Provider Last Rate Last Dose  . regadenoson (LEXISCAN) injection SOLN 0.4 mg  0.4 mg Intravenous Once Rollene Rotunda, MD   0.4 mg at 10/16/10 1300  . technetium  tetrofosmin (TC-MYOVIEW) injection 11 milli Curie  11 milli Curie Intravenous Once PRN Wendall Stade, MD   11 milli Curie at 10/16/10 1015  . technetium tetrofosmin (TC-MYOVIEW) injection 33 milli Curie  33 milli Curie Intravenous Once PRN Wendall Stade, MD   33 milli Curie at 10/16/10 1300    Allergies: Allergies  Allergen Reactions  . Avelox (Moxifloxacin Hcl In Nacl)   . Codeine   . Diazepam   . Hydrocodone   . Hydrocodone-Acetaminophen   . Levaquin   . Levofloxacin   . Moxifloxacin   . Oxycodone Hcl   . Penicillins   . Ropinirole Hydrochloride   . Sulfonamide Derivatives   . Topiramate    History  Substance Use Topics  . Smoking status: Never Smoker   . Smokeless tobacco: Not on file  . Alcohol Use: No    Vital Signs: BP 126/82  Pulse 71  Ht 5\' 1"  (1.549 m)  Wt 195 lb (88.451 kg)  BMI 36.84 kg/m2  PHYSICAL EXAM: Well nourished, well developed, in no acute distress HEENT: normal Neck: no JVD Cardiac:  normal S1, S2; RRR; no murmur Chest: Positive tenderness to palpation over the left chest Lungs:  clear to auscultation bilaterally, no wheezing, rhonchi or rales Abd: soft, nontender, no hepatomegaly Ext: no edema Skin: warm and dry Neuro:  CNs 2-12 intact, no focal abnormalities noted  ASSESSMENT AND PLAN:

## 2010-10-16 NOTE — Assessment & Plan Note (Signed)
Etiology unclear.  I suspect this is more related to anxiety and possibly her asthma.  She did have a normal TSH.  Arrange an event monitor.  Follow up with Dr. Antoine Poche in 2-3 months.

## 2010-10-17 NOTE — Progress Notes (Signed)
COPY ROUTED TO DR. HOCHREIN.Danielle Contreras °  ° °

## 2010-10-23 NOTE — Progress Notes (Signed)
Pt aware of results at appt with Tereso Newcomer.

## 2010-11-26 ENCOUNTER — Ambulatory Visit (INDEPENDENT_AMBULATORY_CARE_PROVIDER_SITE_OTHER): Payer: Medicare Other | Admitting: Gastroenterology

## 2010-11-26 ENCOUNTER — Encounter: Payer: Self-pay | Admitting: Gastroenterology

## 2010-11-26 DIAGNOSIS — K5900 Constipation, unspecified: Secondary | ICD-10-CM | POA: Insufficient documentation

## 2010-11-26 NOTE — Assessment & Plan Note (Addendum)
I suspect that her abdominal complaints are related to underlying constipation, which is probably functional. A structural abnormality of the colon is much less likely.  Recommendations #1 fiber supplementation #2 the patient was instructed to take MiraLax if no bowel movement after 2 days. If this is unsuccessful she will contact the office.

## 2010-11-26 NOTE — Patient Instructions (Addendum)
If no bowel movement after 2 days and begin Miralax

## 2010-11-26 NOTE — Progress Notes (Signed)
History of Present Illness:  Ms Rolm Baptise is a 40 year old white female referred at the request of Dr. Katrinka Blazing for evaluation of abdominal discomfort and constipation. She is complaining of excess gas, constipation andl discomfort both in the left and right upper quadrants. She has painful bowel movements. Stools are soft. There is no history of melena or hematochezia. She has the sensation of an incomplete bowel movement.  In 2009 she was evaluated for dysphagia and chest discomfort and limited rectal bleeding. The latter was felt to be hemorrhoidal. Although an endoscopy with bravo pH study was recommended this was not performed. She now denies pyrosis or dysphagia. She has had chest pain for which he has undergone workup by cardiology which has been negative.    Review of Systems: Pertinent positive and negative review of systems were noted in the above HPI section. All other review of systems were otherwise negative.    Current Medications, Allergies, Past Medical History, Past Surgical History, Family History and Social History were reviewed in [employer] [facility] electronic medical record  Vital signs were reviewed in today's medical record. Physical Exam: General: Well developed , well nourished, no acute distress Head: Normocephalic and atraumatic Eyes:  sclerae anicteric, EOMI Ears: Normal auditory acuity Mouth: No deformity or lesions Lungs: Clear throughout to auscultation Heart: Regular rate and rhythm; no murmurs, rubs or bruits Abdomen: Soft, non tender and non distended. No masses, hepatosplenomegaly or hernias noted. Normal Bowel sounds Rectal:deferred Musculoskeletal: Symmetrical with no gross deformities  Pulses:  Normal pulses noted Extremities: No clubbing, cyanosis, edema or deformities noted Neurological: Alert oriented x 4, grossly nonfocal Psychological:  Alert and cooperative. Normal mood and affect

## 2011-01-14 ENCOUNTER — Ambulatory Visit (INDEPENDENT_AMBULATORY_CARE_PROVIDER_SITE_OTHER): Payer: Medicare Other | Admitting: Cardiology

## 2011-01-14 ENCOUNTER — Encounter: Payer: Self-pay | Admitting: Cardiology

## 2011-01-14 DIAGNOSIS — R079 Chest pain, unspecified: Secondary | ICD-10-CM

## 2011-01-14 DIAGNOSIS — E663 Overweight: Secondary | ICD-10-CM

## 2011-01-14 NOTE — Assessment & Plan Note (Signed)
I reviewed with her the nuclear and stress test.  She has no new complaints.  No further testing is indicated.

## 2011-01-14 NOTE — Patient Instructions (Signed)
Follow up as needed

## 2011-01-14 NOTE — Progress Notes (Signed)
HPI The patient presents for follow up.  Since she was last seen she has had no ne cardiovascular problems.  The patient denies any new symptoms such as chest discomfort, neck or arm discomfort. There has been no new shortness of breath, PND or orthopnea. There have been no reported palpitations, presyncope or syncope.  She has started exercising.    Allergies  Allergen Reactions  . Avelox (Moxifloxacin Hcl In Nacl)   . Codeine   . Diazepam   . Hydrocodone   . Hydrocodone-Acetaminophen   . Levaquin   . Levofloxacin   . Moxifloxacin   . Oxycodone Hcl   . Penicillins   . Ropinirole Hydrochloride   . Sulfonamide Derivatives   . Topiramate     Current Outpatient Prescriptions  Medication Sig Dispense Refill  . albuterol (PROVENTIL) (5 MG/ML) 0.5% nebulizer solution Take 2.5 mg by nebulization every 6 (six) hours as needed.        Marland Kitchen albuterol (PROVENTIL,VENTOLIN) 90 MCG/ACT inhaler Inhale 2 puffs into the lungs every 6 (six) hours as needed.        Marland Kitchen aspirin 81 MG tablet Take 81 mg by mouth daily.        Marland Kitchen azelastine (ASTELIN) 137 MCG/SPRAY nasal spray 1 spray by Nasal route 2 (two) times daily. Use in each nostril as directed       . beclomethasone (QVAR) 80 MCG/ACT inhaler Inhale 2 puffs into the lungs as needed.       . Calcium-Vitamin D-Vitamin K (VIACTIV) 500-100-40 MG-UNT-MCG CHEW Chew by mouth 2 (two) times daily.        . DULoxetine (CYMBALTA) 60 MG capsule Take 60 mg by mouth daily.        Marland Kitchen esomeprazole (NEXIUM) 40 MG capsule Take 40 mg by mouth 2 (two) times daily.        . fish oil-omega-3 fatty acids 1000 MG capsule Take 2 g by mouth 3 (three) times daily.        Marland Kitchen gabapentin (NEURONTIN) 600 MG tablet 2 tabs po 4 times per day       . Polyethyl Glycol-Propyl Glycol (SYSTANE OP) Apply to eye. Twice daily one drop both eyes       . potassium chloride (MICRO-K) 10 MEQ CR capsule Take 10 mEq by mouth daily.        . pramipexole (MIRAPEX) 0.125 MG tablet Take 0.125 mg by mouth at  bedtime.        . promethazine (PHENERGAN) 25 MG tablet Take 25 mg by mouth every 6 (six) hours as needed.        . rosuvastatin (CRESTOR) 20 MG tablet Take 20 mg by mouth daily.        . SUMAtriptan Succinate Refill 6 MG/0.5ML SOLN Inject into the skin. As needed       . traMADol (ULTRAM) 50 MG tablet Take 50 mg by mouth every 6 (six) hours as needed.          Past Medical History  Diagnosis Date  . Breast cancer   . Lymphedema   . Bipolar 1 disorder   . Hyperlipidemia   . GERD (gastroesophageal reflux disease)   . Asthma   . OSA (obstructive sleep apnea)   . RLS (restless legs syndrome)   . IBS (irritable bowel syndrome)   . Plantar fasciitis   . Breast fibrocystic disorder   . Headache   . Heart murmur   . Depression     Past Surgical History  Procedure Date  . Knee surgery   . Tonsillectomy   . Breast lumpectomy     Bilateral   . Mastectomy     Right  . Oophorectomy   . Abdominal hysterectomy 2005    ROS:  As stated in the HPI and negative for all other systems.  PHYSICAL EXAM BP 122/80  Pulse 91  Resp 18  Ht 5\' 1"  (1.549 m)  Wt 197 lb (89.359 kg)  BMI 37.22 kg/m2 GENERAL:  Well appearing HEENT:  Pupils equal round and reactive, fundi not visualized, oral mucosa unremarkable NECK:  No jugular venous distention, waveform within normal limits, carotid upstroke brisk and symmetric, no bruits, no thyromegaly LYMPHATICS:  No cervical, inguinal adenopathy LUNGS:  Clear to auscultation bilaterally BACK:  No CVA tenderness CHEST:  Unremarkable HEART:  PMI not displaced or sustained,S1 and S2 within normal limits, no S3, no S4, no clicks, no rubs, no murmurs ABD:  Flat, positive bowel sounds normal in frequency in pitch, no bruits, no rebound, no guarding, no midline pulsatile mass, no hepatomegaly, no splenomegaly EXT:  2 plus pulses throughout, no edema, no cyanosis no clubbing SKIN:  No rashes no nodules NEURO:  Cranial nerves II through XII grossly intact,  motor grossly intact throughout PSYCH:  Cognitively intact, oriented to person place and time  ASSESSMENT AND PLAN

## 2011-01-14 NOTE — Assessment & Plan Note (Signed)
The patient understands the need to lose weight with diet and exercise. We have discussed specific strategies for this.  

## 2013-10-14 ENCOUNTER — Telehealth: Payer: Self-pay | Admitting: *Deleted

## 2013-10-14 NOTE — Telephone Encounter (Signed)
Received referral from Dr. Margit Banda office.  Called and confirmed 11/08/13 genetic appt w/ pt.  Mailed welcoming packet & calendar to pt. Called Denise at referring to make her aware.  Took paperwork upstairs for counselor.

## 2013-11-07 ENCOUNTER — Telehealth: Payer: Self-pay | Admitting: *Deleted

## 2013-11-07 NOTE — Telephone Encounter (Signed)
Received call from pt stating that she has been in the hospital and is sick.  Requested to cancel the appt and she will call back when she is feeling better to reschedule.  Cancelled appt as requested.

## 2013-11-08 ENCOUNTER — Other Ambulatory Visit: Payer: Self-pay

## 2014-04-07 ENCOUNTER — Telehealth: Payer: Self-pay | Admitting: *Deleted

## 2014-04-07 NOTE — Telephone Encounter (Signed)
Angela Nevin at [facility] called requesting office notes for genetics because the pt was coming in today to see MD.  Looked and saw that pt cancelled her appt and never rescheduled.  Told Carla when pt came in that if we needed to reschedule the appt to call and I will take care of it for her.

## 2014-05-18 DIAGNOSIS — J0100 Acute maxillary sinusitis, unspecified: Secondary | ICD-10-CM | POA: Diagnosis not present

## 2014-05-18 DIAGNOSIS — J209 Acute bronchitis, unspecified: Secondary | ICD-10-CM | POA: Diagnosis not present

## 2014-05-24 DIAGNOSIS — J069 Acute upper respiratory infection, unspecified: Secondary | ICD-10-CM | POA: Diagnosis not present

## 2014-05-24 DIAGNOSIS — J209 Acute bronchitis, unspecified: Secondary | ICD-10-CM | POA: Diagnosis not present

## 2014-06-16 DIAGNOSIS — J310 Chronic rhinitis: Secondary | ICD-10-CM | POA: Diagnosis not present

## 2014-06-16 DIAGNOSIS — G620 Drug-induced polyneuropathy: Secondary | ICD-10-CM | POA: Diagnosis not present

## 2014-06-16 DIAGNOSIS — K219 Gastro-esophageal reflux disease without esophagitis: Secondary | ICD-10-CM | POA: Diagnosis not present

## 2014-06-16 DIAGNOSIS — J45909 Unspecified asthma, uncomplicated: Secondary | ICD-10-CM | POA: Diagnosis not present

## 2014-06-16 DIAGNOSIS — E785 Hyperlipidemia, unspecified: Secondary | ICD-10-CM | POA: Diagnosis not present

## 2014-06-16 DIAGNOSIS — G43909 Migraine, unspecified, not intractable, without status migrainosus: Secondary | ICD-10-CM | POA: Diagnosis not present

## 2014-06-16 DIAGNOSIS — M2550 Pain in unspecified joint: Secondary | ICD-10-CM | POA: Diagnosis not present

## 2014-06-16 DIAGNOSIS — F39 Unspecified mood [affective] disorder: Secondary | ICD-10-CM | POA: Diagnosis not present

## 2014-07-10 DIAGNOSIS — J0100 Acute maxillary sinusitis, unspecified: Secondary | ICD-10-CM | POA: Diagnosis not present

## 2014-07-14 DIAGNOSIS — F39 Unspecified mood [affective] disorder: Secondary | ICD-10-CM | POA: Diagnosis not present

## 2014-07-26 DIAGNOSIS — Z7982 Long term (current) use of aspirin: Secondary | ICD-10-CM | POA: Diagnosis not present

## 2014-07-26 DIAGNOSIS — R0600 Dyspnea, unspecified: Secondary | ICD-10-CM | POA: Diagnosis not present

## 2014-07-26 DIAGNOSIS — E780 Pure hypercholesterolemia: Secondary | ICD-10-CM | POA: Diagnosis not present

## 2014-07-26 DIAGNOSIS — R079 Chest pain, unspecified: Secondary | ICD-10-CM | POA: Diagnosis not present

## 2014-07-26 DIAGNOSIS — N3090 Cystitis, unspecified without hematuria: Secondary | ICD-10-CM | POA: Diagnosis not present

## 2014-07-26 DIAGNOSIS — R531 Weakness: Secondary | ICD-10-CM | POA: Diagnosis not present

## 2014-08-03 DIAGNOSIS — Z6838 Body mass index (BMI) 38.0-38.9, adult: Secondary | ICD-10-CM | POA: Diagnosis not present

## 2014-08-03 DIAGNOSIS — F39 Unspecified mood [affective] disorder: Secondary | ICD-10-CM | POA: Diagnosis not present

## 2014-08-03 DIAGNOSIS — R0789 Other chest pain: Secondary | ICD-10-CM | POA: Diagnosis not present

## 2014-08-03 DIAGNOSIS — B379 Candidiasis, unspecified: Secondary | ICD-10-CM | POA: Diagnosis not present

## 2014-08-15 DIAGNOSIS — B379 Candidiasis, unspecified: Secondary | ICD-10-CM | POA: Diagnosis not present

## 2014-08-22 DIAGNOSIS — R109 Unspecified abdominal pain: Secondary | ICD-10-CM | POA: Diagnosis not present

## 2014-08-22 DIAGNOSIS — Z6838 Body mass index (BMI) 38.0-38.9, adult: Secondary | ICD-10-CM | POA: Diagnosis not present

## 2014-08-22 DIAGNOSIS — N939 Abnormal uterine and vaginal bleeding, unspecified: Secondary | ICD-10-CM | POA: Diagnosis not present

## 2014-08-31 ENCOUNTER — Ambulatory Visit (INDEPENDENT_AMBULATORY_CARE_PROVIDER_SITE_OTHER): Payer: Commercial Managed Care - HMO | Admitting: Cardiology

## 2014-08-31 ENCOUNTER — Encounter: Payer: Self-pay | Admitting: Cardiology

## 2014-08-31 VITALS — BP 122/74 | HR 70 | Ht 61.0 in | Wt 206.4 lb

## 2014-08-31 DIAGNOSIS — R079 Chest pain, unspecified: Secondary | ICD-10-CM

## 2014-08-31 NOTE — Patient Instructions (Signed)
Your physician recommends that you schedule a follow-up appointment As Needed  

## 2014-08-31 NOTE — Progress Notes (Signed)
Cardiology Office Note   Date:  09/01/2014   ID:  ILEEN KAHRE, DOB [birth year], MRN [id number]  PCP:  Daphene Jaeger, PA-C  Cardiologist:   Minus Breeding, MD   Chief Complaint  Patient presents with  . Chest Pain      History of Present Illness: Danielle Contreras is a 44 y.o. female who presents for evaluation of chest pain. I saw her in 2012 for this. She had a negative stress test at that time. She apparently has been to [facility] twice in the last 6 months for this. She said the last time was this spring before that in the fall. She describes multiple aches and pains. She said the most recently the pain has been constant for several months but then when she developed arm discomfort she became more concerned. She said it peaked at 6/10.  It is a pulling and sometimes stabbing discomfort. We are discomfort seems to come and go but not related to activity. She is limited by some joint problems and doesn't exercise routinely .  She has some mild house chores and can't necessarily bring this on. She says some of the discomfort radiates around to her back. She has some palpitations at night when she is lying on her left side. She has some of this discomfort when she moves in bed. She has stomach discomfort. She doesn't have associated nausea vomiting or diaphoresis.   Past Medical History  Diagnosis Date  . Breast cancer   . Lymphedema   . Bipolar 1 disorder   . Hyperlipidemia   . GERD (gastroesophageal reflux disease)   . Asthma   . OSA (obstructive sleep apnea)     Current CPAP is not working  . RLS (restless legs syndrome)   . IBS (irritable bowel syndrome)   . Plantar fasciitis   . Breast fibrocystic disorder   . Headache(784.0)   . Heart murmur   . Depression     Past Surgical History  Procedure Laterality Date  . Knee surgery    . Tonsillectomy    . Breast lumpectomy      Bilateral   . Mastectomy      Right  . Oophorectomy    . Abdominal  hysterectomy  2005     Current Outpatient Prescriptions  Medication Sig Dispense Refill  . albuterol (PROVENTIL) (5 MG/ML) 0.5% nebulizer solution Take 2.5 mg by nebulization every 6 (six) hours as needed.      Marland Kitchen aspirin 81 MG tablet Take 81 mg by mouth daily.      . fish oil-omega-3 fatty acids 1000 MG capsule Take 2 g by mouth 3 (three) times daily.      Marland Kitchen gabapentin (NEURONTIN) 600 MG tablet Take 600 mg by mouth 2 (two) times daily.     Marland Kitchen lamoTRIgine (LAMICTAL) 25 MG tablet Take 25 mg by mouth daily.    . montelukast (SINGULAIR) 10 MG tablet Take 10 mg by mouth at bedtime.    . nabumetone (RELAFEN) 500 MG tablet Take 500 mg by mouth daily.    Marland Kitchen omeprazole (PRILOSEC) 40 MG capsule Take 40 mg by mouth daily.    Vladimir Faster Glycol-Propyl Glycol (SYSTANE OP) Apply to eye. Twice daily one drop both eyes     . potassium chloride SA (K-DUR,KLOR-CON) 20 MEQ tablet Take 20 mEq by mouth daily.    . pravastatin (PRAVACHOL) 80 MG tablet Take 80 mg by mouth daily.    . promethazine (PHENERGAN) 25 MG  tablet Take 25 mg by mouth every 6 (six) hours as needed.      . SUMAtriptan (IMITREX) 100 MG tablet Take 100 mg by mouth as needed for migraine. May repeat in 2 hours if headache persists or recurs.    . traMADol (ULTRAM) 50 MG tablet Take 50 mg by mouth every 6 (six) hours as needed.       No current facility-administered medications for this visit.    Allergies:   Avelox; Codeine; Diazepam; Hydrocodone; Hydrocodone-acetaminophen; Levofloxacin; Levofloxacin; Moxifloxacin; Oxycodone hcl; Penicillins; Ropinirole hydrochloride; Sulfonamide derivatives; and Topiramate    Social History:  The patient  reports that she has never smoked. She has never used smokeless tobacco. She reports that she does not drink alcohol or use illicit drugs.   Family History:  The patient's family history includes Breast cancer in her mother; Coronary artery disease in her brother; Diabetes in her father and mother; Heart  disease in her maternal grandmother; Hyperlipidemia in her father and mother; Hypertension in her brother and father; Stroke in her maternal grandmother. There is no history of Colon cancer.    ROS:  Please see the history of present illness.   Otherwise, review of systems are positive for migraines, recent bronchitis, chronic fatigue, nausea.   All other systems are reviewed and negative.    PHYSICAL EXAM: VS:  BP 122/74 mmHg  Pulse 70  Ht 5\' 1"  (1.549 m)  Wt 206 lb 6.4 oz (93.622 kg)  BMI 39.02 kg/m2 , BMI Body mass index is 39.02 kg/(m^2). GENERAL:  Well appearing HEENT:  Pupils equal round and reactive, fundi not visualized, oral mucosa unremarkable NECK:  No jugular venous distention, waveform within normal limits, carotid upstroke brisk and symmetric, no bruits, no thyromegaly LYMPHATICS:  No cervical, inguinal adenopathy LUNGS:  Clear to auscultation bilaterally BACK:  No CVA tenderness CHEST:  Unremarkable HEART:  PMI not displaced or sustained,S1 and S2 within normal limits, no S3, no S4, no clicks, no rubs, no murmurs ABD:  Flat, positive bowel sounds normal in frequency in pitch, no bruits, no rebound, no guarding, no midline pulsatile mass, no hepatomegaly, no splenomegaly EXT:  2 plus pulses throughout, no edema, no cyanosis no clubbing SKIN:  No rashes no nodules NEURO:  Cranial nerves II through XII grossly intact, motor grossly intact throughout PSYCH:  Cognitively intact, oriented to person place and time    EKG:  EKG is ordered today. The ekg ordered today demonstrates sinus rhythm, rate 70, short PR interval, axis within normal limits, no acute ST-T wave changes.   Recent Labs: No results found for requested labs within last 365 days.     Wt Readings from Last 3 Encounters:  08/31/14 206 lb 6.4 oz (93.622 kg)  01/14/11 197 lb (89.359 kg)  11/26/10 196 lb (88.905 kg)      Other studies Reviewed: Additional studies/ records that were reviewed today  include: Previous stress test images. Review of the above records demonstrates:  Please see elsewhere in the note.     ASSESSMENT AND PLAN:  CHEST PAIN:  Her pain is quite atypical. The pretest probability of obstructive coronary disease is low. However, she does have risk factors. She has a very difficult time getting IVs. Therefore, I think we need to try to do a POET (Plain Old Exercise Treadmill).  I would be looking for high risk findings before suggesting any further imaging.  FATIGUE:   I suspect this is related to sleep apnea. We discussed this at  length. She's been to go back and try to get a new CPAP as hers doesn't work.   Current medicines are reviewed at length with the patient today.  The patient does not have concerns regarding medicines.  The following changes have been made:  no change  Labs/ tests ordered today include:   Orders Placed This Encounter  Procedures  . EKG 12-Lead     Disposition:   FU with me as needed    Signed, Minus Breeding, MD  09/01/2014 8:11 AM    [facility]

## 2014-10-18 DIAGNOSIS — N952 Postmenopausal atrophic vaginitis: Secondary | ICD-10-CM | POA: Diagnosis not present

## 2014-10-18 DIAGNOSIS — Z1272 Encounter for screening for malignant neoplasm of vagina: Secondary | ICD-10-CM | POA: Diagnosis not present

## 2014-10-18 DIAGNOSIS — R1030 Lower abdominal pain, unspecified: Secondary | ICD-10-CM | POA: Diagnosis not present

## 2014-10-19 DIAGNOSIS — Z1272 Encounter for screening for malignant neoplasm of vagina: Secondary | ICD-10-CM | POA: Diagnosis not present

## 2014-11-09 DIAGNOSIS — M545 Low back pain: Secondary | ICD-10-CM | POA: Diagnosis not present

## 2014-11-09 DIAGNOSIS — J310 Chronic rhinitis: Secondary | ICD-10-CM | POA: Diagnosis not present

## 2014-11-09 DIAGNOSIS — Z6838 Body mass index (BMI) 38.0-38.9, adult: Secondary | ICD-10-CM | POA: Diagnosis not present

## 2014-11-10 DIAGNOSIS — M5186 Other intervertebral disc disorders, lumbar region: Secondary | ICD-10-CM | POA: Diagnosis not present

## 2014-11-10 DIAGNOSIS — M545 Low back pain: Secondary | ICD-10-CM | POA: Diagnosis not present

## 2014-11-10 DIAGNOSIS — M5136 Other intervertebral disc degeneration, lumbar region: Secondary | ICD-10-CM | POA: Diagnosis not present

## 2015-02-12 DIAGNOSIS — J0100 Acute maxillary sinusitis, unspecified: Secondary | ICD-10-CM | POA: Diagnosis not present

## 2015-02-28 DIAGNOSIS — J45909 Unspecified asthma, uncomplicated: Secondary | ICD-10-CM | POA: Diagnosis not present

## 2015-02-28 DIAGNOSIS — G4733 Obstructive sleep apnea (adult) (pediatric): Secondary | ICD-10-CM | POA: Diagnosis not present

## 2015-02-28 DIAGNOSIS — G43909 Migraine, unspecified, not intractable, without status migrainosus: Secondary | ICD-10-CM | POA: Diagnosis not present

## 2015-02-28 DIAGNOSIS — Z6838 Body mass index (BMI) 38.0-38.9, adult: Secondary | ICD-10-CM | POA: Diagnosis not present

## 2015-02-28 DIAGNOSIS — M545 Low back pain: Secondary | ICD-10-CM | POA: Diagnosis not present

## 2015-03-15 DIAGNOSIS — Z6837 Body mass index (BMI) 37.0-37.9, adult: Secondary | ICD-10-CM | POA: Diagnosis not present

## 2015-03-15 DIAGNOSIS — E785 Hyperlipidemia, unspecified: Secondary | ICD-10-CM | POA: Diagnosis not present

## 2015-03-15 DIAGNOSIS — R5383 Other fatigue: Secondary | ICD-10-CM | POA: Diagnosis not present

## 2015-03-15 DIAGNOSIS — M2550 Pain in unspecified joint: Secondary | ICD-10-CM | POA: Diagnosis not present

## 2015-03-16 ENCOUNTER — Institutional Professional Consult (permissible substitution): Payer: Commercial Managed Care - HMO | Admitting: Pulmonary Disease

## 2015-03-20 ENCOUNTER — Ambulatory Visit (INDEPENDENT_AMBULATORY_CARE_PROVIDER_SITE_OTHER)
Admission: RE | Admit: 2015-03-20 | Discharge: 2015-03-20 | Disposition: A | Discharge status: Home / Self Care | Payer: Commercial Managed Care - HMO | Source: Ambulatory Visit | Attending: Internal Medicine | Admitting: Internal Medicine

## 2015-03-20 ENCOUNTER — Ambulatory Visit (INDEPENDENT_AMBULATORY_CARE_PROVIDER_SITE_OTHER): Payer: Commercial Managed Care - HMO | Admitting: Internal Medicine

## 2015-03-20 ENCOUNTER — Encounter: Payer: Self-pay | Admitting: Internal Medicine

## 2015-03-20 VITALS — BP 122/80 | HR 90 | Ht 61.0 in | Wt 202.4 lb

## 2015-03-20 DIAGNOSIS — R05 Cough: Secondary | ICD-10-CM

## 2015-03-20 DIAGNOSIS — R0600 Dyspnea, unspecified: Secondary | ICD-10-CM | POA: Diagnosis not present

## 2015-03-20 DIAGNOSIS — R059 Cough, unspecified: Secondary | ICD-10-CM

## 2015-03-20 DIAGNOSIS — R0602 Shortness of breath: Secondary | ICD-10-CM | POA: Diagnosis not present

## 2015-03-20 MED ORDER — FAMOTIDINE 20 MG PO TABS: ORAL_TABLET | ORAL | Status: DC

## 2015-03-20 MED ORDER — PANTOPRAZOLE SODIUM 40 MG PO TBEC
40.0000 mg | DELAYED_RELEASE_TABLET | Freq: Every day | ORAL | Status: DC
Start: 2015-03-20 — End: 2015-03-20

## 2015-03-20 NOTE — Progress Notes (Signed)
Subjective:     Patient ID: Danielle Contreras, female   DOB: Jun 07, 1970,    MRN: [id number]  HPI  89 yowf never smoker good ex tol / no resp problems until in her 42's seasonal sneezing/runny nose/ stuffy head  became year round eval by allergist - Danielle Contreras - worse on shots took x several months "got worse"  On shots / temporarily better on abx then started cough around 2013 daily   since then and no better from singular/ breo / but daily  neb helps some so referred 03/20/2015  to pulmonary by Danielle Contreras. PA   03/20/2015 1st [facility] office visit/ Danielle Contreras   Chief Complaint  Patient presents with  . Pulmonary Consult    Referred by Danielle Contreras. Pt c/o DOE for the past 2-3 yrs, worse for the past 6 months. She gets out of breath walking up any incline, or walking to her neighbors house and back.  She states "I am constantly coughing up stuff"- clear to yellow sputum. She sometimes coughs until she vomits.   baseline wt 218  And down since onset of refractory cough and doe x 2-3 y Slower than nl pace even next door to mothers gets sob  Worse at hs cough gagging and vomiting.   No obvious patterns in  day to day or daytime variability or assoc c cp or chest tightness, subjective wheeze or overt sinus or hb symptoms. No unusual exp hx or h/o childhood pna/ asthma or knowledge of premature birth.  Sleeping ok without nocturnal  or early am exacerbation  of respiratory  c/o's or need for noct saba. Also denies any obvious fluctuation of symptoms with weather or environmental changes or other aggravating or alleviating factors except as outlined above   Current Medications, Allergies, Complete Past Medical History, Past Surgical History, Family History, and Social History were reviewed in [employer] record.  ROS  The following are not active complaints unless bolded sore throat, dysphagia, dental problems, itching, sneezing,  nasal congestion or excess/ purulent  secretions, ear ache,   fever, chills, sweats, unintended wt loss, classically pleuritic or exertional cp, hemoptysis,  orthopnea pnd or leg swelling, presyncope, palpitations, abdominal pain, anorexia, nausea, vomiting, diarrhea  or change in bowel or bladder habits, change in stools or urine, dysuria,hematuria,  rash, arthralgias, visual complaints, headache, numbness, weakness or ataxia or problems with walking or coordination,  change in mood/affect or memory.          Review of Systems     Objective:   Physical Exam amb wf nad  Wt Readings from Last 3 Encounters:  03/20/15 202 lb 6.4 oz (91.808 kg)  08/31/14 206 lb 6.4 oz (93.622 kg)  01/14/11 197 lb (89.359 kg)    Vital signs reviewed  HEENT: nl dentition, turbinates, and oropharynx. Nl external ear canals without cough reflex   NECK :  without JVD/Nodes/TM/ nl carotid upstrokes bilaterally   LUNGS: no acc muscle use, clear to A and P bilaterally without cough on insp or exp maneuvers   CV:  RRR  no s3 or murmur or increase in P2, no edema   ABD:  soft and nontender with nl excursion in the supine position. No bruits or organomegaly, bowel sounds nl  MS:  warm without deformities, calf tenderness, cyanosis or clubbing  SKIN: warm and dry without lesions    NEURO:  alert, approp, no deficits     CXR PA and Lateral:   03/20/2015 :  I personally reviewed images and agree with radiology impression as follows:    No active disease.      Assessment:

## 2015-03-20 NOTE — Progress Notes (Signed)
Quick Note:  Spoke with pt and notified of results per Dr. Wert. Pt verbalized understanding and denied any questions.  ______ 

## 2015-03-20 NOTE — Patient Instructions (Addendum)
Stop breo and singulair   If you can't get your breath or stop coughing ok to use your nebulizer  Omeprazole  40 mg   Take  30-60 min before first meal of the day and Pepcid (famotidine)  20 mg one @  bedtime until return to office - this is the best way to tell whether stomach acid is contributing to your problem.    For drainage / throat tickle try take CHLORPHENIRAMINE  4 mg - take one every 4 hours as needed - available over the counter- may cause drowsiness so start with just a bedtime dose or two and see how you tolerate it before trying in daytime (get the sinus scan first)     GERD (REFLUX)  is an extremely common cause of respiratory symptoms just like yours , many times with no obvious heartburn at all.    It can be treated with medication, but also with lifestyle changes including elevation of the head of your bed (ideally with 6 inch  bed blocks),  Smoking cessation, avoidance of late meals, excessive alcohol, and avoid fatty foods, chocolate, peppermint, colas, red wine, and acidic juices such as orange juice.  NO MINT OR MENTHOL PRODUCTS SO NO COUGH DROPS  USE SUGARLESS CANDY INSTEAD (Jolley ranchers or Stover's or Life Savers) or even ice chips will also do - the key is to swallow to prevent all throat clearing. NO OIL BASED VITAMINS - use powdered substitutes.   Please see patient coordinator before you leave today  to schedule sinus CT   Please remember to go to the  x-ray department downstairs for your tests - we will call you with the results when they are available.  Please schedule a follow up office visit in 2 weeks, sooner if needed bring all active medications with you and your blood work

## 2015-03-21 ENCOUNTER — Encounter: Payer: Self-pay | Admitting: Internal Medicine

## 2015-03-21 NOTE — Assessment & Plan Note (Addendum)
The most common causes of chronic cough in immunocompetent adults include the following: upper airway cough syndrome (UACS), previously referred to as postnasal drip syndrome (PNDS), which is caused by variety of rhinosinus conditions; (2) asthma; (3) GERD; (4) chronic bronchitis from cigarette smoking or other inhaled environmental irritants; (5) nonasthmatic eosinophilic bronchitis; and (6) bronchiectasis.   These conditions, singly or in combination, have accounted for up to 94% of the causes of chronic cough in prospective studies.   Other conditions have constituted no >6% of the causes in prospective studies These have included bronchogenic carcinoma, chronic interstitial pneumonia, sarcoidosis, left ventricular failure, ACEI-induced cough, and aspiration from a condition associated with pharyngeal dysfunction.    Chronic cough is often simultaneously caused by more than one condition. A single cause has been found from 38 to 82% of the time, multiple causes from 18 to 62%. Multiply caused cough has been the result of three diseases up to 42% of the time.       Based on hx and exam, this is most likely:  Classic Upper airway cough syndrome, so named because it's frequently impossible to sort out how much is  CR/sinusitis with freq throat clearing (which can be related to primary GERD)   vs  causing  secondary (" extra esophageal")  GERD from wide swings in gastric pressure that occur with throat clearing, often  promoting self use of mint and menthol lozenges that reduce the lower esophageal sphincter tone and exacerbate the problem further in a cyclical fashion.   These are the same pts (now being labeled as having "irritable larynx syndrome" by some cough centers) who not infrequently have a history of having failed to tolerate ace inhibitors,  dry powder inhalers(like BREO) or biphosphonates or report having atypical reflux symptoms that don't respond to standard doses of PPI , and are easily  confused as having aecopd or asthma flares by even experienced allergists/ pulmonologists.   The first step is to maximize acid suppression and eliminate cyclical coughing then regroup in 2 weeks with sinus ct in meantime ordered   I had an extended discussion with the patient reviewing all relevant studies completed to date and  lasting 35 min  1) Explained: The standardized cough guidelines published in Chest by Lissa Morales in 2006 are still the best available and consist of a multiple step process (up to 12!) , not a single office visit,  and are intended  to address this problem logically,  with an alogrithm dependent on response to empiric treatment at  each progressive step  to determine a specific diagnosis with  minimal addtional testing needed. Therefore if adherence is an issue or can't be accurately verified,  it's very unlikely the standard evaluation and treatment will be successful here.    Furthermore, response to therapy (other than acute cough suppression, which should only be used short term with avoidance of narcotic containing cough syrups if possible), can be a gradual process for which the patient may perceive immediate benefit.  Unlike going to an eye doctor where the best perscription is almost always the first one and is immediately effective, this is almost never the case in the management of chronic cough syndromes. Therefore the patient needs to commit up front to consistently adhere to recommendations  for up to 6 weeks of therapy directed at the likely underlying problem(s) before the response can be reasonably evaluated.     2) Each maintenance medication was reviewed in detail including most importantly the difference  between maintenance and prns and under what circumstances the prns are to be triggered using an action plan format that is not reflected in the computer generated alphabetically organized AVS.    Please see instructions for details which were reviewed  in writing and the patient given a copy highlighting the part that I personally wrote and discussed at today's ov.   See instructions for specific recommendations which were reviewed directly with the patient who was given a copy with highlighter outlining the key components.

## 2015-03-21 NOTE — Assessment & Plan Note (Addendum)
03/20/2015  Walked RA x 3 laps @ 185 ft each stopped due to End of study, slow pace,  No  desat  But sob most of the way (not bad enough to stop)   Symptoms are markedly disproportionate to objective findings and not clear this is a lung problem but pt does appear to have difficult airway management issues. DDX of  difficult airways management all start with A and  include Adherence, Ace Inhibitors, Acid Reflux, Active Sinus Disease, Alpha 1 Antitripsin deficiency, Anxiety masquerading as Airways dz,  ABPA,  allergy(esp in young), Aspiration (esp in elderly), Adverse effects of meds,  Active smokers, A bunch of PE's (a small clot burden can't cause this syndrome unless there is already severe underlying pulm or vascular dz with poor reserve) plus two Bs  = Bronchiectasis and Beta blocker use..and one C= CHF  Adherence is always the initial "prime suspect" and is a multilayered concern that requires a "trust but verify" approach in every patient - starting with knowing how to use medications, especially inhalers, correctly, keeping up with refills and understanding the fundamental difference between maintenance and prns vs those medications only taken for a very short course and then stopped and not refilled.   ? Acid (or non-acid) GERD > always difficult to exclude as up to 75% of pts in some series report no assoc GI/ Heartburn symptoms> rec max (24h)  acid suppression and diet restrictions/ reviewed and instructions given in writing.   ? Anxiety >  usually at the bottom of this list of usual suspects but should be much higher on this pt's based on H and P and note emr suggests she is bipolar.   ? Active sinus dz > sinus ct  ? Allergy > note reports that does have allergies per Dr. Neldon Mc but could not stay on shots for unclear reasons. May explore this further on her next visit depending on her response to treatment. In the meantime she is clearly not responded to Singulair/aspirin stop it. She can just  use her albuterol nebulizer as needed and see if she notices any increase in the need off of Brio and Singulair, the reversible therapeutic trial if you will.  ? Adverse effects of dpi > try off dulera

## 2015-03-21 NOTE — Assessment & Plan Note (Signed)
Complicated by hyperlipidemia and prob GERD (vomits when coughs)  Body mass index is 38.26 kg/(m^2).  No results found for: TSH   Contributing to gerd tendency/ doe/reviewed the need and the process to achieve and maintain neg calorie balance > defer f/u primary care including intermittently monitoring thyroid status

## 2015-03-23 ENCOUNTER — Ambulatory Visit (INDEPENDENT_AMBULATORY_CARE_PROVIDER_SITE_OTHER)
Admission: RE | Admit: 2015-03-23 | Discharge: 2015-03-23 | Disposition: A | Discharge status: Home / Self Care | Payer: Commercial Managed Care - HMO | Source: Ambulatory Visit | Attending: Internal Medicine | Admitting: Internal Medicine

## 2015-03-23 DIAGNOSIS — R05 Cough: Secondary | ICD-10-CM

## 2015-03-23 DIAGNOSIS — R059 Cough, unspecified: Secondary | ICD-10-CM

## 2015-03-23 DIAGNOSIS — J0100 Acute maxillary sinusitis, unspecified: Secondary | ICD-10-CM | POA: Diagnosis not present

## 2015-03-26 ENCOUNTER — Telehealth: Payer: Self-pay | Admitting: Internal Medicine

## 2015-03-26 NOTE — Telephone Encounter (Signed)
Notes Recorded by Inge Rise, CMA on 03/26/2015 at 9:10 AM No # in chart for pt. Called spouse Simona Huh # at [phone] [facility] x1 ------ Notes Recorded by Tanda Rockers, MD on 03/24/2015 at 5:47 AM Call patient : Study is unremarkable, no change in recs> be sure has f/u if not 100% better ----------------------- Pt is aware of results. Nothing further was needed.

## 2015-04-03 ENCOUNTER — Ambulatory Visit (INDEPENDENT_AMBULATORY_CARE_PROVIDER_SITE_OTHER): Payer: Commercial Managed Care - HMO | Admitting: Internal Medicine

## 2015-04-03 ENCOUNTER — Encounter: Payer: Self-pay | Admitting: Internal Medicine

## 2015-04-03 VITALS — BP 146/110 | HR 78 | Ht 61.0 in | Wt 200.2 lb

## 2015-04-03 DIAGNOSIS — I10 Essential (primary) hypertension: Secondary | ICD-10-CM

## 2015-04-03 DIAGNOSIS — R05 Cough: Secondary | ICD-10-CM | POA: Diagnosis not present

## 2015-04-03 DIAGNOSIS — R059 Cough, unspecified: Secondary | ICD-10-CM

## 2015-04-03 NOTE — Patient Instructions (Addendum)
Stop calcium carbonate - calcium gluconate is ok    For drainage / throat tickle try take CHLORPHENIRAMINE  4 mg - take one every 4 hours as needed - available over the counter- may cause drowsiness so start with just a bedtime dose or two and see how you tolerate it before trying in daytime   GERD (REFLUX)  is an extremely common cause of respiratory symptoms just like yours , many times with no obvious heartburn at all.    It can be treated with medication, but also with lifestyle changes including elevation of the head of your bed (ideally with 6 inch  bed blocks),  Smoking cessation, avoidance of late meals, excessive alcohol, and avoid fatty foods, chocolate, peppermint, colas, red wine, and acidic juices such as orange juice.  NO MINT OR MENTHOL PRODUCTS SO NO COUGH DROPS  USE SUGARLESS CANDY INSTEAD (Jolley ranchers or Stover's or Life Savers) or even ice chips will also do - the key is to swallow to prevent all throat clearing. NO OIL BASED VITAMINS - use powdered substitutes.  If can't catch your breath or stop coughing >  ok to use the nebulizer up to  every 4h  - also ok to use before walking if you know it's going to make you short of breath to see what if any benefit you get.   If can't stop clearing the throat >  gabapentin 100 mg three times a day with meals or 600 twice daily x 3-5 days to see if throat clearing improves   If not better return just with the medications you are actively taking in hand

## 2015-04-03 NOTE — Progress Notes (Signed)
Subjective:     Patient ID: Danielle Contreras, female   DOB: June 27, 1970    MRN: [id number]    Brief patient profile:  41 yowf never smoker good ex tol / no resp problems until in her 20's seasonal sneezing/runny nose/ stuffy head  became year round eval by allergist - Kozlow - rx shots took x several months "got worse"  On shots / temporarily better on abx then started cough around 2013 daily since then and no better from singular/ breo / but daily  neb helps some so referred 03/20/2015  to pulmonary by [place]. PA   History of Present Illness  03/20/2015 1st [facility] office visit/ Danielle Contreras   Chief Complaint  Patient presents with  . Pulmonary Consult    Referred by Daphene Jaeger. Pt c/o DOE for the past 2-3 yrs, worse for the past 6 months. She gets out of breath walking up any incline, or walking to her neighbors house and back.  She states "I am constantly coughing up stuff"- clear to yellow sputum. She sometimes coughs until she vomits.   baseline wt 218  And down since onset of refractory cough and doe x 2-3 y Slower than nl pace even next door to mothers gets sob  Worse at hs cough gagging and vomiting  rec Stop breo and singulair  If you can't get your breath or stop coughing ok to use your nebulizer Omeprazole  40 mg   Take  30-60 min before first meal of the day and Pepcid (famotidine)  20 mg one @  bedtime until return to office  For drainage / throat tickle try take CHLORPHENIRAMINE  4 mg - take one every 4 hours as needed - available over the counter- may cause drowsiness so start with just a bedtime dose or two and see how you tolerate it before trying in daytime (get the sinus scan first)    GERD diet  Please see patient coordinator before you leave today  to schedule sinus CT  Please remember to go to the  x-ray department downstairs for your tests - we will call you with the results when they are available. Please schedule a follow up office visit in 2 weeks, sooner  if needed bring all active medications with you and your blood work     04/03/2015  F/u extended  ov/Danielle Contreras re: cough daily x 2 y Chief Complaint  Patient presents with  . Follow-up    Pt states her breathing is unchanged and also cough is no better.   cough is at its best p a round of abx - levaquin most recent sev m prior to OV  But sinus ct neg last ov  Clearing her throat daily better chlorpheniramine at hs 10 pm and sleeps until noon with or without the h1 Can't tol neurontin in any form except 600 mg hs and note cough / throat clearing worse for the 12 hours (daytime) when she doesn't take it Cough to point of gag/ vomit, also triggered by brushing teeth  No obvious day to day or daytime variability or assoc   cp or chest tightness, subjective wheeze or overt sinus or hb symptoms. No unusual exp hx or h/o childhood pna/ asthma or knowledge of premature birth.  Sleeping ok without nocturnal  or early am exacerbation  of respiratory  c/o's or need for noct saba. Also denies any obvious fluctuation of symptoms with weather or environmental changes or other aggravating or alleviating factors except  as outlined above   Current Medications, Allergies, Complete Past Medical History, Past Surgical History, Family History, and Social History were reviewed in [employer] record.  ROS  The following are not active complaints unless bolded sore throat, dysphagia, dental problems, itching, sneezing,  nasal congestion or excess/ purulent secretions, ear ache,   fever, chills, sweats, unintended wt loss, classically pleuritic or exertional cp, hemoptysis,  orthopnea pnd or leg swelling, presyncope, palpitations, abdominal pain, anorexia, nausea, vomiting, diarrhea  or change in bowel or bladder habits, change in stools or urine, dysuria,hematuria,  rash, arthralgias, visual complaints, headache, numbness, weakness or ataxia or problems with walking or coordination,  change in  mood/affect or memory.              Objective:   Physical Exam   amb hoarse obese  wf nad/ hopeless/ helpless affect   04/03/2015     200  03/20/15 202 lb 6.4 oz (91.808 kg)  08/31/14 206 lb 6.4 oz (93.622 kg)  01/14/11 197 lb (89.359 kg)    Vital signs reviewed  HEENT: nl dentition, turbinates, and oropharynx. Nl external ear canals without cough reflex   NECK :  without JVD/Nodes/TM/ nl carotid upstrokes bilaterally   LUNGS: no acc muscle use, clear to A and P bilaterally without cough on insp or exp maneuvers   CV:  RRR  no s3 or murmur or increase in P2, no edema   ABD:  soft and nontender with nl excursion in the supine position. No bruits or organomegaly, bowel sounds nl  MS:  warm without deformities, calf tenderness, cyanosis or clubbing  SKIN: warm and dry without lesions    NEURO:  alert, approp, no deficits     CXR PA and Lateral:   03/20/2015 :    I personally reviewed images and agree with radiology impression as follows:    No active disease.      Assessment:        Outpatient Encounter Prescriptions as of 04/03/2015  Medication Sig  . albuterol (PROVENTIL) (5 MG/ML) 0.5% nebulizer solution Take 2.5 mg by nebulization every 6 (six) hours as needed.    Marland Kitchen aspirin 81 MG tablet Take 81 mg by mouth daily.  . Calcium Carb-Cholecalciferol (CALCIUM PLUS VITAMIN D3 PO) Take 1 tablet by mouth daily.  . chlorpheniramine (CHLOR-TRIMETON) 4 MG tablet Take 4 mg by mouth every 4 (four) hours as needed for allergies.  . Cholecalciferol (VITAMIN D3) 5000 UNITS TABS Take 1 tablet by mouth daily.  . famotidine (PEPCID) 20 MG tablet One at bedtime  . fluticasone (FLONASE) 50 MCG/ACT nasal spray Place 2 sprays into both nostrils 2 (two) times daily.  Marland Kitchen gabapentin (NEURONTIN) 600 MG tablet Take 600 mg by mouth 2 (two) times daily.   . meclizine (ANTIVERT) 25 MG tablet Take 25 mg by mouth 3 (three) times daily as needed for dizziness.  . nabumetone (RELAFEN) 500 MG  tablet Take 500 mg by mouth daily.  Marland Kitchen omeprazole (PRILOSEC) 40 MG capsule Take 40 mg by mouth daily.  . potassium chloride SA (K-DUR,KLOR-CON) 20 MEQ tablet Take 20 mEq by mouth daily.  . pravastatin (PRAVACHOL) 80 MG tablet Take 80 mg by mouth daily.  . promethazine (PHENERGAN) 25 MG tablet Take 25 mg by mouth every 6 (six) hours as needed for nausea or vomiting.  . SUMAtriptan (IMITREX) 100 MG tablet Take 100 mg by mouth as needed for migraine. May repeat in 2 hours if headache persists or recurs.  Marland Kitchen  traMADol (ULTRAM) 50 MG tablet Take 50 mg by mouth every 6 (six) hours as needed.    . [DISCONTINUED] Phenylephrine-Ibuprofen (ADVIL SINUS CONGESTION & PAIN PO) Per bottle directions as needed   No facility-administered encounter medications on file as of 04/03/2015.

## 2015-04-05 ENCOUNTER — Encounter: Payer: Self-pay | Admitting: Internal Medicine

## 2015-04-05 DIAGNOSIS — I10 Essential (primary) hypertension: Secondary | ICD-10-CM | POA: Insufficient documentation

## 2015-04-05 NOTE — Assessment & Plan Note (Signed)
Advised to avoid decongestants / salt >  Follow up per Primary Care planned

## 2015-04-05 NOTE — Assessment & Plan Note (Signed)
Complicated by hyperlipidemia and hbp and  prob GERD (vomits when coughs)  Body mass index is 37.85 kg/(m^2).  No results found for: TSH   Contributing to gerd tendency/ doe/reviewed the need and the process to achieve and maintain neg calorie balance > defer f/u primary care including intermittently monitoring thyroid status

## 2015-04-05 NOTE — Assessment & Plan Note (Signed)
NO 03/20/2015 : could not perform - Sinus CT rec 03/24/2015 > No significant sinus inflammatory disease.   Continue to feel this is nothing more than  Classic Upper airway cough syndrome, so named because it's frequently impossible to sort out how much is  CR/sinusitis with freq throat clearing (which can be related to primary GERD)   vs  causing  secondary (" extra esophageal")  GERD from wide swings in gastric pressure that occur with throat clearing, often  promoting self use of mint and menthol lozenges that reduce the lower esophageal sphincter tone and exacerbate the problem further in a cyclical fashion.   These are the same pts (now being labeled as having "irritable larynx syndrome" by some cough centers) who not infrequently have a history of having failed to tolerate ace inhibitors,  dry powder inhalers or biphosphonates or report having atypical reflux symptoms that don't respond to standard doses of PPI , and are easily confused as having aecopd or asthma flares by even experienced allergists/ pulmonologists.   Need to focus on stopping the throat clearing and if she can't cooperate with these efforts the cough is not going to resolve and strongly rec refer to Voice center at [facility] next but in meantime  I had an extended discussion with the patient and husband reviewing all relevant studies completed to date and  lasting 15 to 20 minutes of a 25 minute visit    1) need to adhere to max gerd recs and diet/acid suppression  2) use h1 even if it makes her sleepy to stop all coughing/ throat clearing for a min of 3 straight days (says x 2 years this never happened, even with abx which she prev reported worked the best   3) Unlike when you get a prescription for eyeglasses, it's not possible to always walk out of this or any medical office with a perfect prescription that is immediately effective  based on any test that we offer here.    On the contrary, it may take several weeks for the  full impact of changes recommened today - hopefully you will respond well.  If not, then we'll adjust your medication on your next visit accordingly, knowing more then than we can possibly know now.       4)  Each maintenance medication was reviewed in detail including most importantly the difference between maintenance and prns and under what circumstances the prns are to be triggered using an action plan format that is not reflected in the computer generated alphabetically organized AVS.      Please see instructions for details which were reviewed in writing and the patient given a copy highlighting the part that I personally wrote and discussed at today's ov.

## 2015-04-11 DIAGNOSIS — M542 Cervicalgia: Secondary | ICD-10-CM | POA: Diagnosis not present

## 2015-04-11 DIAGNOSIS — Z6838 Body mass index (BMI) 38.0-38.9, adult: Secondary | ICD-10-CM | POA: Diagnosis not present

## 2015-04-11 DIAGNOSIS — Z9011 Acquired absence of right breast and nipple: Secondary | ICD-10-CM | POA: Diagnosis not present

## 2015-04-11 DIAGNOSIS — Z1231 Encounter for screening mammogram for malignant neoplasm of breast: Secondary | ICD-10-CM | POA: Diagnosis not present

## 2015-04-13 DIAGNOSIS — Z171 Estrogen receptor negative status [ER-]: Secondary | ICD-10-CM | POA: Diagnosis not present

## 2015-04-13 DIAGNOSIS — Z853 Personal history of malignant neoplasm of breast: Secondary | ICD-10-CM | POA: Diagnosis not present

## 2015-04-13 DIAGNOSIS — Z803 Family history of malignant neoplasm of breast: Secondary | ICD-10-CM | POA: Diagnosis not present

## 2015-04-13 DIAGNOSIS — Z8049 Family history of malignant neoplasm of other genital organs: Secondary | ICD-10-CM | POA: Diagnosis not present

## 2015-04-23 DIAGNOSIS — H81399 Other peripheral vertigo, unspecified ear: Secondary | ICD-10-CM | POA: Diagnosis not present

## 2015-04-23 DIAGNOSIS — J0100 Acute maxillary sinusitis, unspecified: Secondary | ICD-10-CM | POA: Diagnosis not present

## 2015-05-16 DIAGNOSIS — Z853 Personal history of malignant neoplasm of breast: Secondary | ICD-10-CM | POA: Diagnosis not present

## 2015-06-25 DIAGNOSIS — J069 Acute upper respiratory infection, unspecified: Secondary | ICD-10-CM | POA: Diagnosis not present

## 2015-10-02 DIAGNOSIS — L309 Dermatitis, unspecified: Secondary | ICD-10-CM | POA: Diagnosis not present

## 2015-10-02 DIAGNOSIS — Z6837 Body mass index (BMI) 37.0-37.9, adult: Secondary | ICD-10-CM | POA: Diagnosis not present

## 2015-10-02 DIAGNOSIS — Z1389 Encounter for screening for other disorder: Secondary | ICD-10-CM | POA: Diagnosis not present

## 2015-10-22 DIAGNOSIS — L299 Pruritus, unspecified: Secondary | ICD-10-CM | POA: Diagnosis not present

## 2015-10-22 DIAGNOSIS — L300 Nummular dermatitis: Secondary | ICD-10-CM | POA: Diagnosis not present

## 2015-10-22 DIAGNOSIS — L853 Xerosis cutis: Secondary | ICD-10-CM | POA: Diagnosis not present

## 2015-11-27 DIAGNOSIS — J302 Other seasonal allergic rhinitis: Secondary | ICD-10-CM | POA: Diagnosis not present

## 2015-11-27 DIAGNOSIS — M2550 Pain in unspecified joint: Secondary | ICD-10-CM | POA: Diagnosis not present

## 2015-11-27 DIAGNOSIS — E669 Obesity, unspecified: Secondary | ICD-10-CM | POA: Diagnosis not present

## 2015-11-27 DIAGNOSIS — Z6837 Body mass index (BMI) 37.0-37.9, adult: Secondary | ICD-10-CM | POA: Diagnosis not present

## 2015-11-27 DIAGNOSIS — K589 Irritable bowel syndrome without diarrhea: Secondary | ICD-10-CM | POA: Diagnosis not present

## 2015-11-27 DIAGNOSIS — R399 Unspecified symptoms and signs involving the genitourinary system: Secondary | ICD-10-CM | POA: Diagnosis not present

## 2015-12-13 DIAGNOSIS — J309 Allergic rhinitis, unspecified: Secondary | ICD-10-CM | POA: Diagnosis not present

## 2015-12-13 DIAGNOSIS — H25013 Cortical age-related cataract, bilateral: Secondary | ICD-10-CM | POA: Diagnosis not present

## 2015-12-13 DIAGNOSIS — J0100 Acute maxillary sinusitis, unspecified: Secondary | ICD-10-CM | POA: Diagnosis not present

## 2015-12-13 DIAGNOSIS — H04123 Dry eye syndrome of bilateral lacrimal glands: Secondary | ICD-10-CM | POA: Diagnosis not present

## 2015-12-17 DIAGNOSIS — R768 Other specified abnormal immunological findings in serum: Secondary | ICD-10-CM | POA: Diagnosis not present

## 2015-12-17 DIAGNOSIS — B372 Candidiasis of skin and nail: Secondary | ICD-10-CM | POA: Diagnosis not present

## 2015-12-17 DIAGNOSIS — M2550 Pain in unspecified joint: Secondary | ICD-10-CM | POA: Diagnosis not present

## 2015-12-17 DIAGNOSIS — R7982 Elevated C-reactive protein (CRP): Secondary | ICD-10-CM | POA: Diagnosis not present

## 2016-02-07 DIAGNOSIS — E669 Obesity, unspecified: Secondary | ICD-10-CM | POA: Diagnosis not present

## 2016-02-07 DIAGNOSIS — R002 Palpitations: Secondary | ICD-10-CM | POA: Diagnosis not present

## 2016-02-07 DIAGNOSIS — R42 Dizziness and giddiness: Secondary | ICD-10-CM | POA: Diagnosis not present

## 2016-02-07 DIAGNOSIS — Z6836 Body mass index (BMI) 36.0-36.9, adult: Secondary | ICD-10-CM | POA: Diagnosis not present

## 2016-02-15 DIAGNOSIS — Z23 Encounter for immunization: Secondary | ICD-10-CM | POA: Diagnosis not present

## 2016-02-15 DIAGNOSIS — Z6836 Body mass index (BMI) 36.0-36.9, adult: Secondary | ICD-10-CM | POA: Diagnosis not present

## 2016-02-15 DIAGNOSIS — E669 Obesity, unspecified: Secondary | ICD-10-CM | POA: Diagnosis not present

## 2016-02-15 DIAGNOSIS — J3089 Other allergic rhinitis: Secondary | ICD-10-CM | POA: Diagnosis not present

## 2016-02-27 DIAGNOSIS — R6889 Other general symptoms and signs: Secondary | ICD-10-CM | POA: Diagnosis not present

## 2016-02-27 DIAGNOSIS — Z6836 Body mass index (BMI) 36.0-36.9, adult: Secondary | ICD-10-CM | POA: Diagnosis not present

## 2016-02-27 DIAGNOSIS — E669 Obesity, unspecified: Secondary | ICD-10-CM | POA: Diagnosis not present

## 2016-02-27 DIAGNOSIS — R0781 Pleurodynia: Secondary | ICD-10-CM | POA: Diagnosis not present

## 2016-02-27 DIAGNOSIS — R42 Dizziness and giddiness: Secondary | ICD-10-CM | POA: Diagnosis not present

## 2016-02-27 DIAGNOSIS — J3089 Other allergic rhinitis: Secondary | ICD-10-CM | POA: Diagnosis not present

## 2016-02-27 DIAGNOSIS — H6983 Other specified disorders of Eustachian tube, bilateral: Secondary | ICD-10-CM | POA: Diagnosis not present

## 2016-03-19 DIAGNOSIS — R51 Headache: Secondary | ICD-10-CM | POA: Diagnosis not present

## 2016-03-19 DIAGNOSIS — R221 Localized swelling, mass and lump, neck: Secondary | ICD-10-CM | POA: Diagnosis not present

## 2016-03-19 DIAGNOSIS — R1310 Dysphagia, unspecified: Secondary | ICD-10-CM | POA: Diagnosis not present

## 2016-03-19 DIAGNOSIS — J342 Deviated nasal septum: Secondary | ICD-10-CM | POA: Diagnosis not present

## 2016-03-19 DIAGNOSIS — R0989 Other specified symptoms and signs involving the circulatory and respiratory systems: Secondary | ICD-10-CM | POA: Diagnosis not present

## 2016-04-08 DIAGNOSIS — N3943 Post-void dribbling: Secondary | ICD-10-CM | POA: Diagnosis not present

## 2016-04-08 DIAGNOSIS — Z6836 Body mass index (BMI) 36.0-36.9, adult: Secondary | ICD-10-CM | POA: Diagnosis not present

## 2016-04-08 DIAGNOSIS — M5430 Sciatica, unspecified side: Secondary | ICD-10-CM | POA: Diagnosis not present

## 2016-04-08 DIAGNOSIS — R339 Retention of urine, unspecified: Secondary | ICD-10-CM | POA: Diagnosis not present

## 2016-04-11 DIAGNOSIS — Z1231 Encounter for screening mammogram for malignant neoplasm of breast: Secondary | ICD-10-CM | POA: Diagnosis not present

## 2016-04-11 DIAGNOSIS — E041 Nontoxic single thyroid nodule: Secondary | ICD-10-CM | POA: Diagnosis not present

## 2016-04-11 DIAGNOSIS — S299XXA Unspecified injury of thorax, initial encounter: Secondary | ICD-10-CM | POA: Diagnosis not present

## 2016-04-11 DIAGNOSIS — R0781 Pleurodynia: Secondary | ICD-10-CM | POA: Diagnosis not present

## 2016-04-11 DIAGNOSIS — R221 Localized swelling, mass and lump, neck: Secondary | ICD-10-CM | POA: Diagnosis not present

## 2016-04-17 IMAGING — DX DG CHEST 2V
2 series · 2 of 2 positions shown · non-contrast
Comparison: 07/26/2014

CLINICAL DATA: Chronic cough and shortness of Breath

EXAM:
CHEST - 2 VIEW

[chest pa]
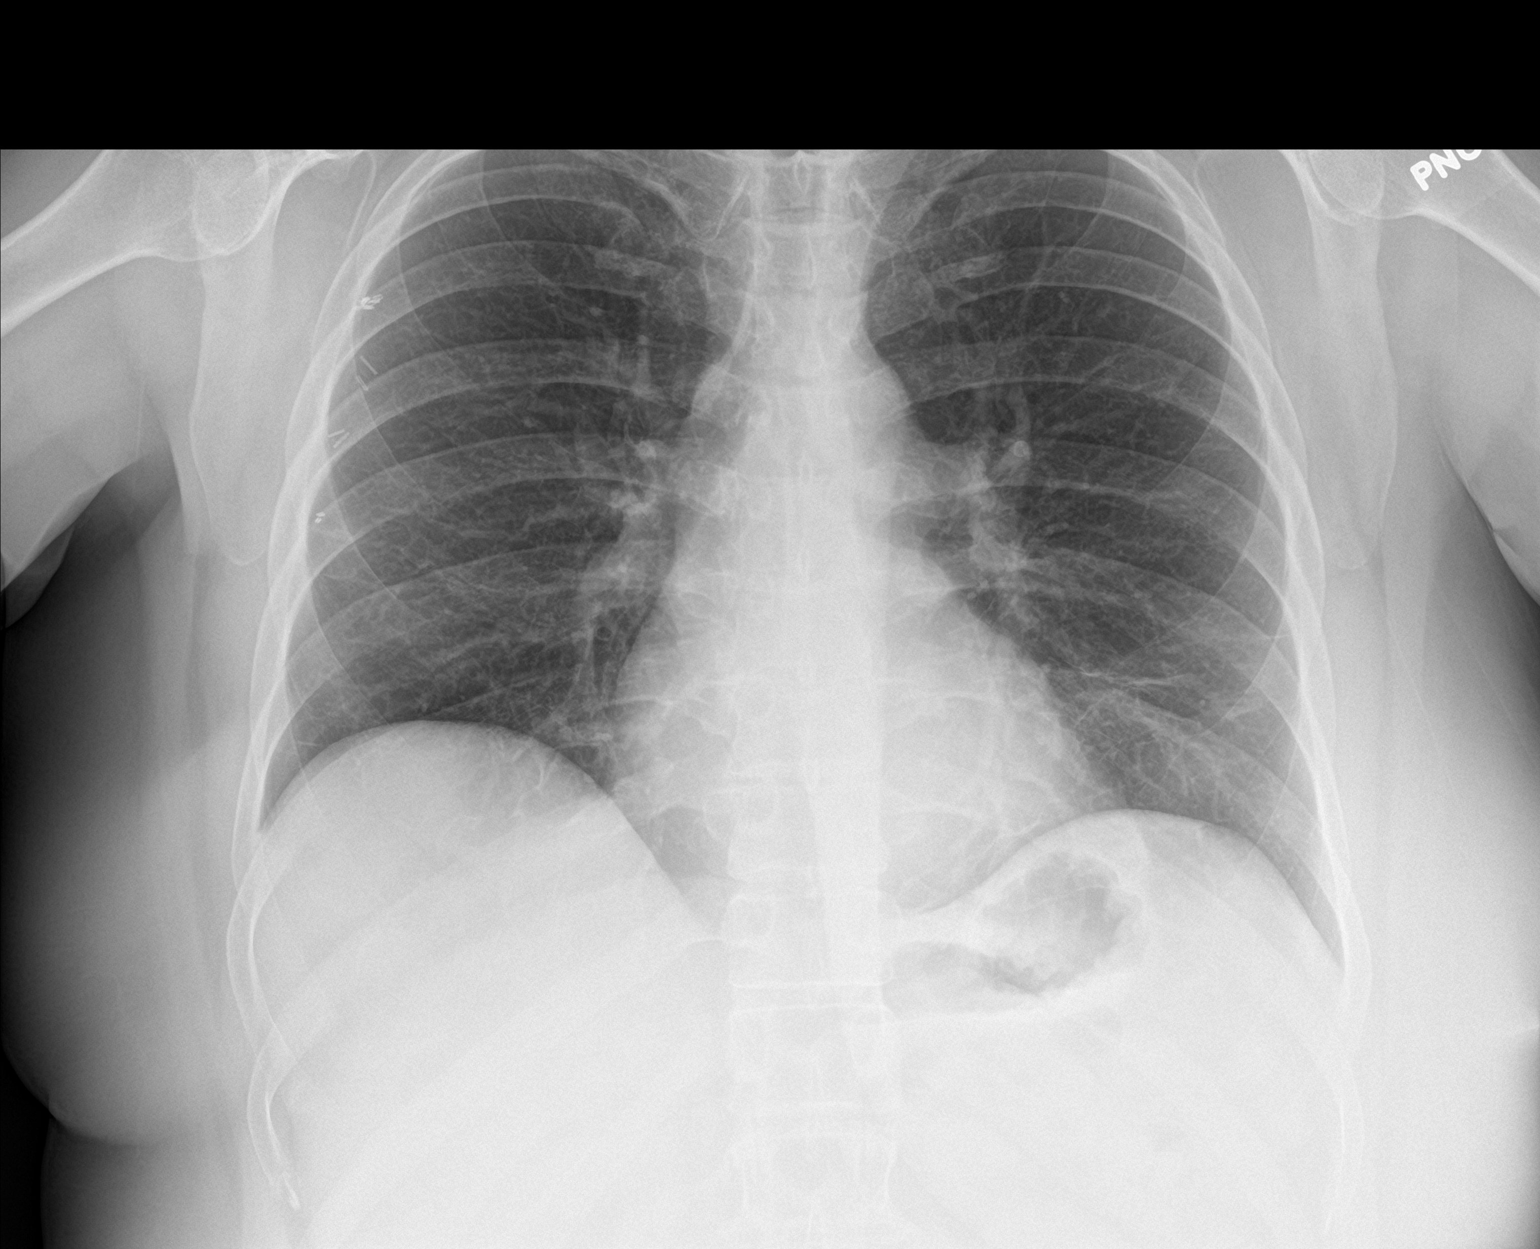

[chest lat]
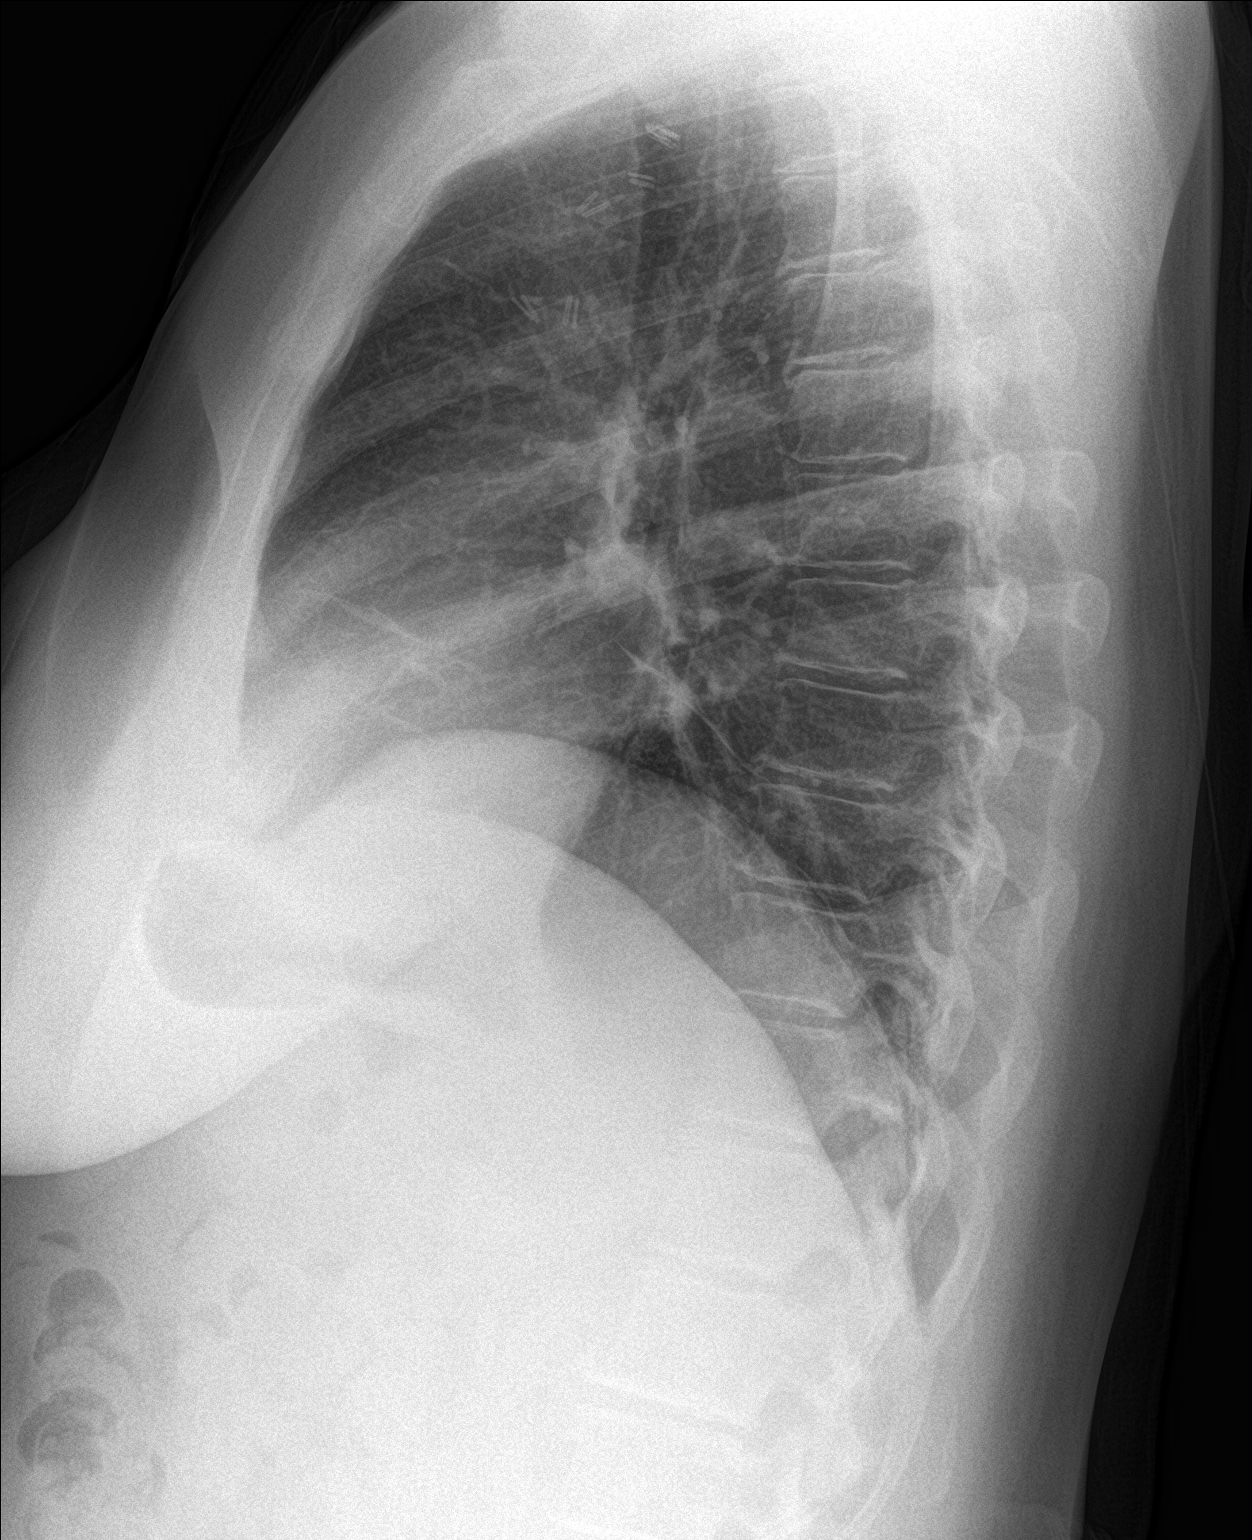

[2 of 2 positions shown; findings below may reference images not displayed]

FINDINGS: The heart size and mediastinal contours are within normal limits.
Both lungs are clear. The visualized skeletal structures are
unremarkable. Postoperative changes in the right axilla are again
noted. Minimal linear scarring is noted in the left lung base.
IMPRESSION: No active disease.

## 2016-04-20 IMAGING — CT CT PARANASAL SINUSES LIMITED
1 series · 16 of 18 positions shown, 20 images · non-contrast
Comparison: 01/14/2013

CLINICAL DATA: Chronic cough 6 weeks with right-sided sinus
infection.

EXAM:
CT PARANASAL SINUS LIMITED WITHOUT CONTRAST
TECHNIQUE: Non-contiguous multidetector CT images of the paranasal sinuses were
obtained in a single plane without contrast.

[Series 3: ltd sinus 3.0 h30s · axial · 0.34mm/px · z∈[+45,+140]mm · 16 of 18 slices shown, 20 images]
[im 2/18  brain]
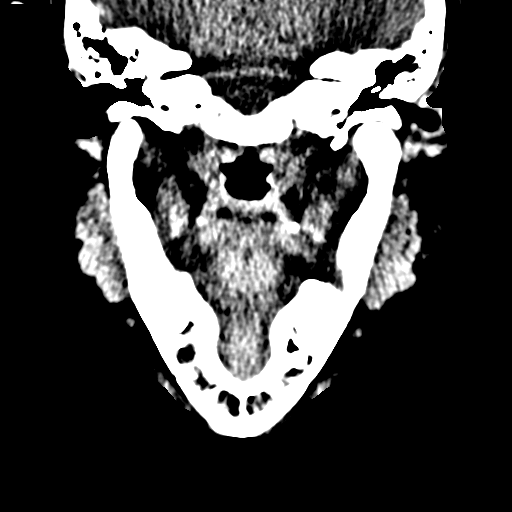
[im 2/18  bone]
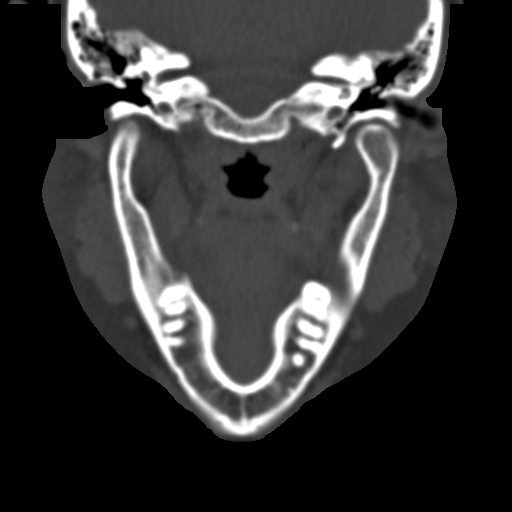
[im 3/18  bone]
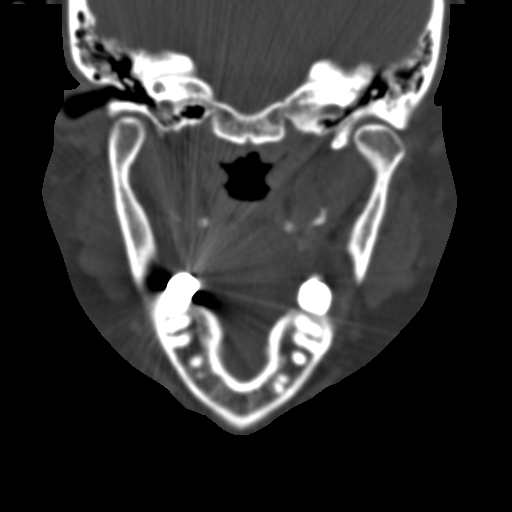
[im 4/18  bone]
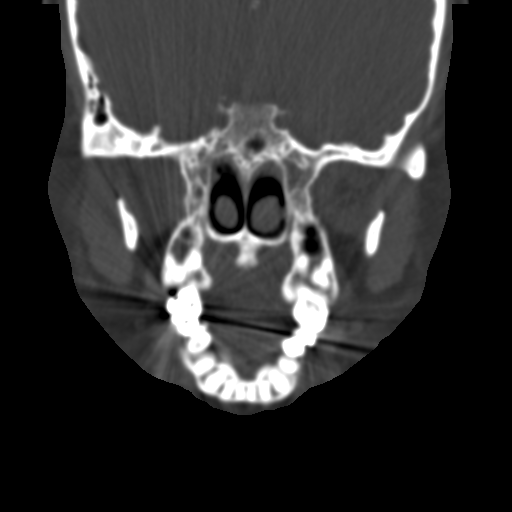
[im 5/18  bone]
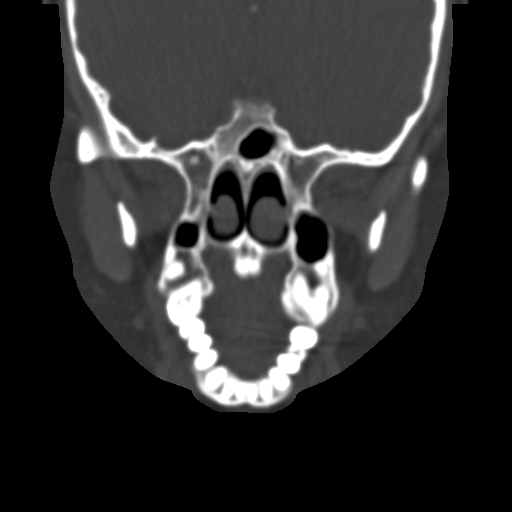
[im 6/18  brain]
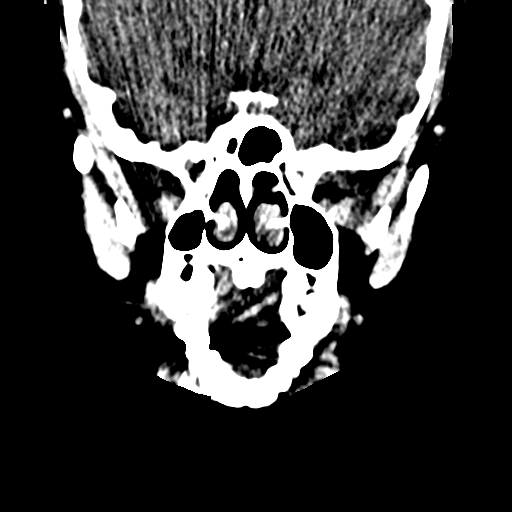
[im 6/18  bone]
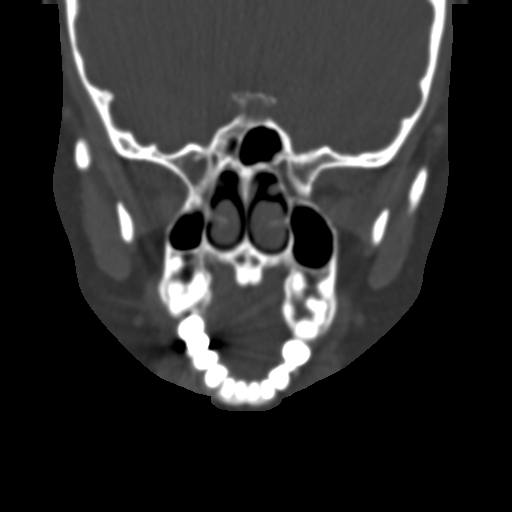
[im 7/18  bone]
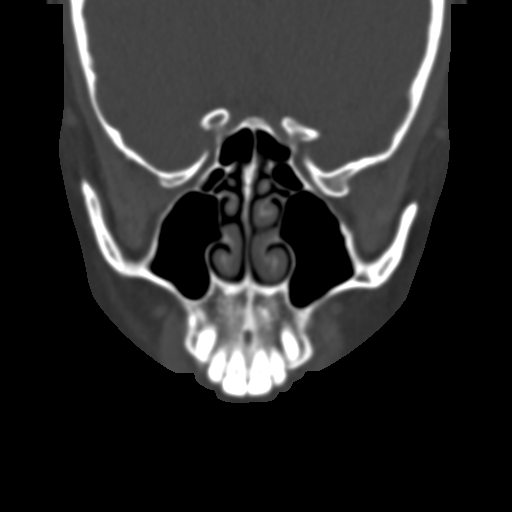
[im 8/18  bone]
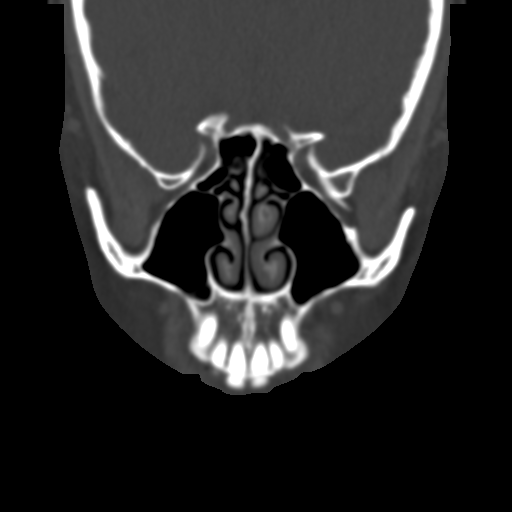
[im 9/18  bone]
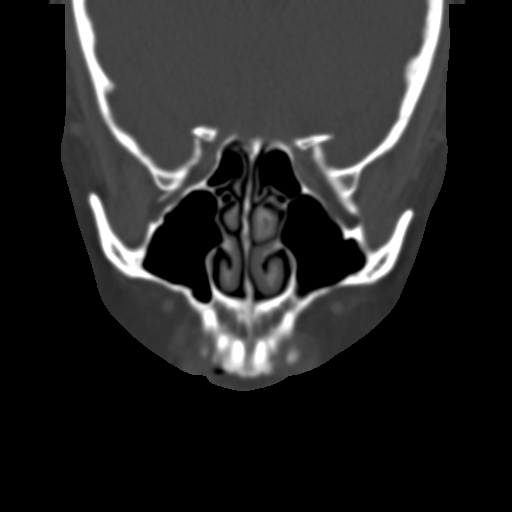
[im 10/18  brain]
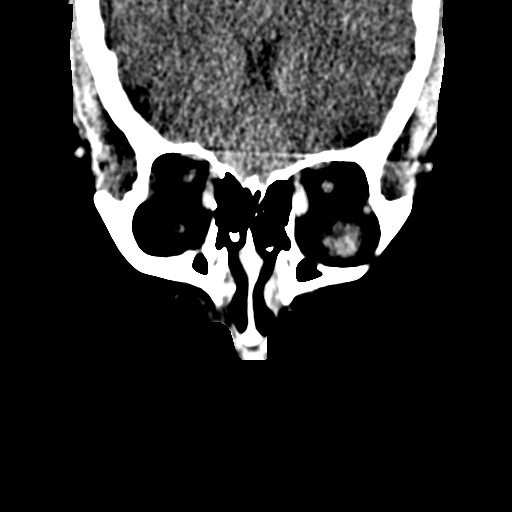
[im 10/18  bone]
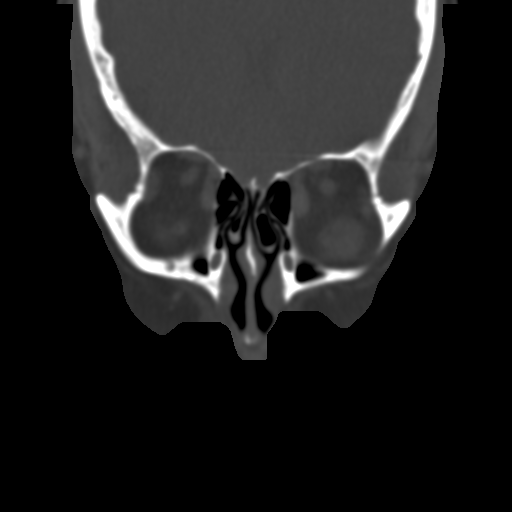
[im 11/18  bone]
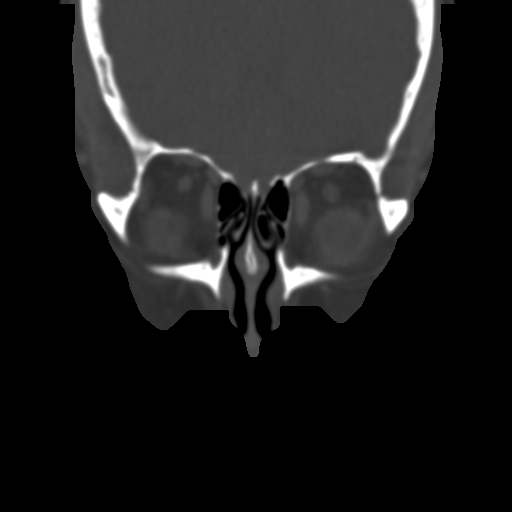
[im 12/18  bone]
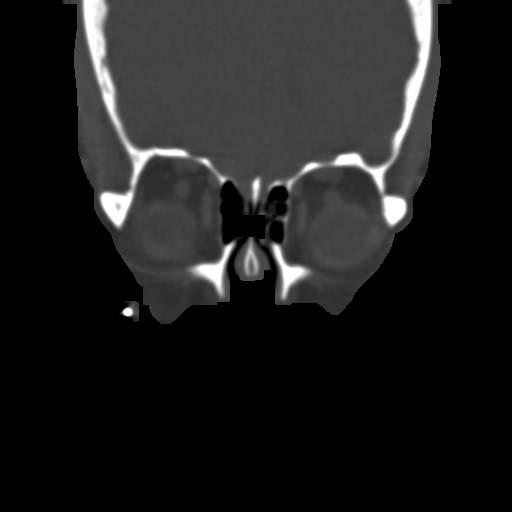
[im 13/18  bone]
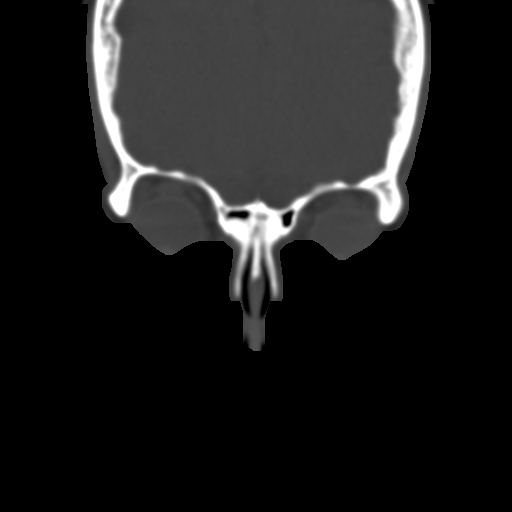
[im 14/18  brain]
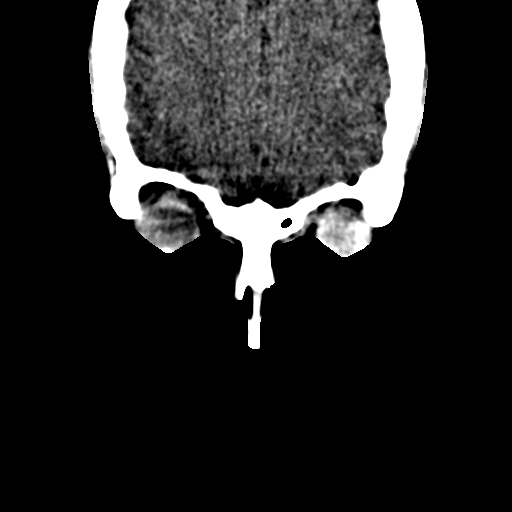
[im 14/18  bone]
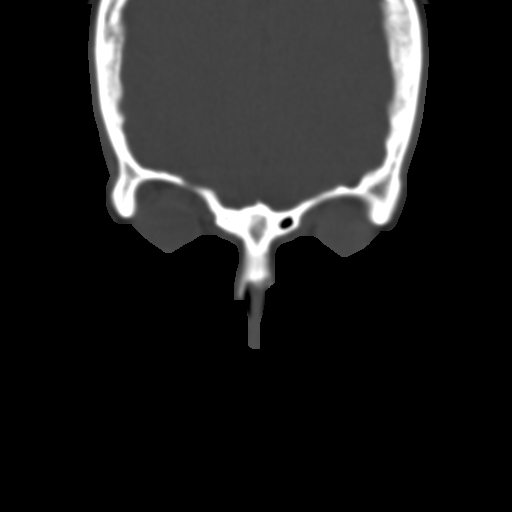
[im 15/18  bone]
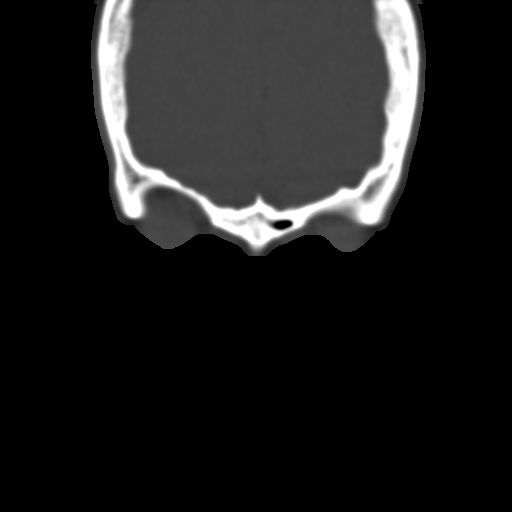
[im 16/18  bone]
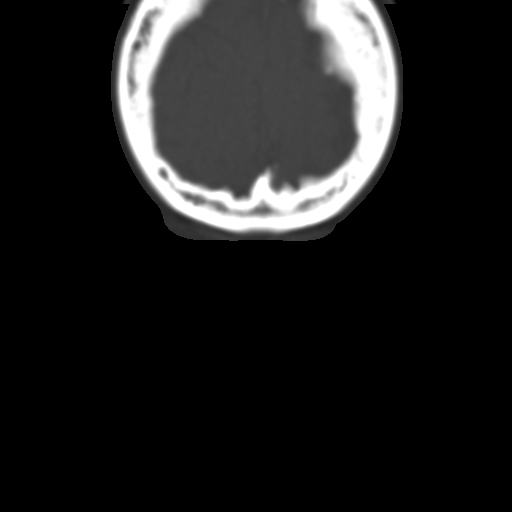
[im 17/18  bone]
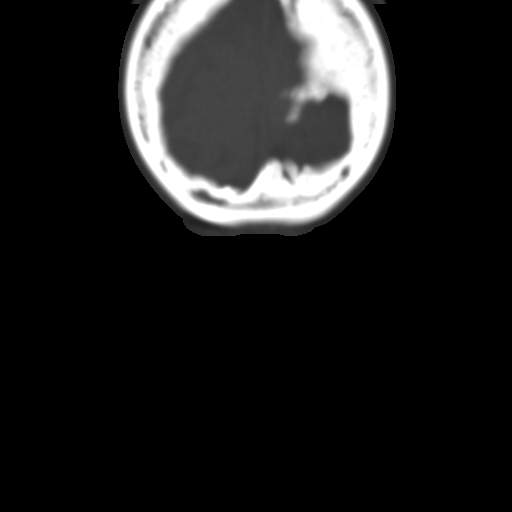

[16 of 18 positions shown; findings below may reference images not displayed]

FINDINGS: Exam demonstrates hypoplastic frontal sinuses. Remainder of the
paranasal sinuses are well developed and well aerated without
significant mucosal membrane thickening or air-fluid levels. Region
of the ostiomeatal complexes are patent. Visualized mastoid air
cells are clear. Remainder of the exam is within normal.
IMPRESSION: No significant sinus inflammatory disease.

## 2016-04-21 DIAGNOSIS — J342 Deviated nasal septum: Secondary | ICD-10-CM | POA: Diagnosis not present

## 2016-04-21 DIAGNOSIS — R221 Localized swelling, mass and lump, neck: Secondary | ICD-10-CM | POA: Diagnosis not present

## 2016-04-21 DIAGNOSIS — R1310 Dysphagia, unspecified: Secondary | ICD-10-CM | POA: Diagnosis not present

## 2016-05-07 DIAGNOSIS — Z1239 Encounter for other screening for malignant neoplasm of breast: Secondary | ICD-10-CM | POA: Diagnosis not present

## 2016-05-07 DIAGNOSIS — Z79899 Other long term (current) drug therapy: Secondary | ICD-10-CM | POA: Diagnosis not present

## 2016-05-08 DIAGNOSIS — Z6837 Body mass index (BMI) 37.0-37.9, adult: Secondary | ICD-10-CM | POA: Diagnosis not present

## 2016-05-08 DIAGNOSIS — E669 Obesity, unspecified: Secondary | ICD-10-CM | POA: Diagnosis not present

## 2016-05-08 DIAGNOSIS — M2550 Pain in unspecified joint: Secondary | ICD-10-CM | POA: Diagnosis not present

## 2016-05-16 DIAGNOSIS — N390 Urinary tract infection, site not specified: Secondary | ICD-10-CM | POA: Diagnosis not present

## 2016-05-20 DIAGNOSIS — G629 Polyneuropathy, unspecified: Secondary | ICD-10-CM | POA: Diagnosis not present

## 2016-05-20 DIAGNOSIS — G894 Chronic pain syndrome: Secondary | ICD-10-CM | POA: Diagnosis not present

## 2016-05-20 DIAGNOSIS — M542 Cervicalgia: Secondary | ICD-10-CM | POA: Diagnosis not present

## 2016-05-20 DIAGNOSIS — G43909 Migraine, unspecified, not intractable, without status migrainosus: Secondary | ICD-10-CM | POA: Diagnosis not present

## 2016-05-20 DIAGNOSIS — C50919 Malignant neoplasm of unspecified site of unspecified female breast: Secondary | ICD-10-CM | POA: Diagnosis not present

## 2016-05-20 DIAGNOSIS — M797 Fibromyalgia: Secondary | ICD-10-CM | POA: Diagnosis not present

## 2016-05-20 DIAGNOSIS — M47816 Spondylosis without myelopathy or radiculopathy, lumbar region: Secondary | ICD-10-CM | POA: Diagnosis not present

## 2016-06-17 DIAGNOSIS — M797 Fibromyalgia: Secondary | ICD-10-CM | POA: Diagnosis not present

## 2016-06-17 DIAGNOSIS — G629 Polyneuropathy, unspecified: Secondary | ICD-10-CM | POA: Diagnosis not present

## 2016-06-17 DIAGNOSIS — M542 Cervicalgia: Secondary | ICD-10-CM | POA: Diagnosis not present

## 2016-06-17 DIAGNOSIS — G43909 Migraine, unspecified, not intractable, without status migrainosus: Secondary | ICD-10-CM | POA: Diagnosis not present

## 2016-06-17 DIAGNOSIS — M47816 Spondylosis without myelopathy or radiculopathy, lumbar region: Secondary | ICD-10-CM | POA: Diagnosis not present

## 2016-06-17 DIAGNOSIS — C50919 Malignant neoplasm of unspecified site of unspecified female breast: Secondary | ICD-10-CM | POA: Diagnosis not present

## 2016-06-17 DIAGNOSIS — G894 Chronic pain syndrome: Secondary | ICD-10-CM | POA: Diagnosis not present

## 2016-07-28 DIAGNOSIS — G43909 Migraine, unspecified, not intractable, without status migrainosus: Secondary | ICD-10-CM | POA: Diagnosis not present

## 2016-07-28 DIAGNOSIS — C50919 Malignant neoplasm of unspecified site of unspecified female breast: Secondary | ICD-10-CM | POA: Diagnosis not present

## 2016-07-28 DIAGNOSIS — G894 Chronic pain syndrome: Secondary | ICD-10-CM | POA: Diagnosis not present

## 2016-07-28 DIAGNOSIS — G629 Polyneuropathy, unspecified: Secondary | ICD-10-CM | POA: Diagnosis not present

## 2016-07-28 DIAGNOSIS — M542 Cervicalgia: Secondary | ICD-10-CM | POA: Diagnosis not present

## 2016-07-28 DIAGNOSIS — M797 Fibromyalgia: Secondary | ICD-10-CM | POA: Diagnosis not present

## 2016-07-28 DIAGNOSIS — M47816 Spondylosis without myelopathy or radiculopathy, lumbar region: Secondary | ICD-10-CM | POA: Diagnosis not present

## 2016-08-27 DIAGNOSIS — G43909 Migraine, unspecified, not intractable, without status migrainosus: Secondary | ICD-10-CM | POA: Diagnosis not present

## 2016-08-27 DIAGNOSIS — G629 Polyneuropathy, unspecified: Secondary | ICD-10-CM | POA: Diagnosis not present

## 2016-08-27 DIAGNOSIS — M47816 Spondylosis without myelopathy or radiculopathy, lumbar region: Secondary | ICD-10-CM | POA: Diagnosis not present

## 2016-08-27 DIAGNOSIS — G894 Chronic pain syndrome: Secondary | ICD-10-CM | POA: Diagnosis not present

## 2016-08-27 DIAGNOSIS — M797 Fibromyalgia: Secondary | ICD-10-CM | POA: Diagnosis not present

## 2016-08-27 DIAGNOSIS — M542 Cervicalgia: Secondary | ICD-10-CM | POA: Diagnosis not present

## 2016-08-27 DIAGNOSIS — C50919 Malignant neoplasm of unspecified site of unspecified female breast: Secondary | ICD-10-CM | POA: Diagnosis not present

## 2016-09-25 DIAGNOSIS — C50919 Malignant neoplasm of unspecified site of unspecified female breast: Secondary | ICD-10-CM | POA: Diagnosis not present

## 2016-09-25 DIAGNOSIS — M542 Cervicalgia: Secondary | ICD-10-CM | POA: Diagnosis not present

## 2016-09-25 DIAGNOSIS — G43909 Migraine, unspecified, not intractable, without status migrainosus: Secondary | ICD-10-CM | POA: Diagnosis not present

## 2016-09-25 DIAGNOSIS — M47816 Spondylosis without myelopathy or radiculopathy, lumbar region: Secondary | ICD-10-CM | POA: Diagnosis not present

## 2016-09-25 DIAGNOSIS — G629 Polyneuropathy, unspecified: Secondary | ICD-10-CM | POA: Diagnosis not present

## 2016-09-25 DIAGNOSIS — M797 Fibromyalgia: Secondary | ICD-10-CM | POA: Diagnosis not present

## 2016-09-25 DIAGNOSIS — G894 Chronic pain syndrome: Secondary | ICD-10-CM | POA: Diagnosis not present

## 2016-10-22 DIAGNOSIS — M47816 Spondylosis without myelopathy or radiculopathy, lumbar region: Secondary | ICD-10-CM | POA: Diagnosis not present

## 2016-10-22 DIAGNOSIS — G43909 Migraine, unspecified, not intractable, without status migrainosus: Secondary | ICD-10-CM | POA: Diagnosis not present

## 2016-10-22 DIAGNOSIS — G629 Polyneuropathy, unspecified: Secondary | ICD-10-CM | POA: Diagnosis not present

## 2016-10-22 DIAGNOSIS — M797 Fibromyalgia: Secondary | ICD-10-CM | POA: Diagnosis not present

## 2016-10-22 DIAGNOSIS — M542 Cervicalgia: Secondary | ICD-10-CM | POA: Diagnosis not present

## 2016-10-22 DIAGNOSIS — G894 Chronic pain syndrome: Secondary | ICD-10-CM | POA: Diagnosis not present

## 2016-10-22 DIAGNOSIS — C50919 Malignant neoplasm of unspecified site of unspecified female breast: Secondary | ICD-10-CM | POA: Diagnosis not present

## 2016-10-27 DIAGNOSIS — M545 Low back pain: Secondary | ICD-10-CM | POA: Diagnosis not present

## 2016-10-27 DIAGNOSIS — J309 Allergic rhinitis, unspecified: Secondary | ICD-10-CM | POA: Diagnosis not present

## 2016-10-27 DIAGNOSIS — G43019 Migraine without aura, intractable, without status migrainosus: Secondary | ICD-10-CM | POA: Diagnosis not present

## 2016-10-27 DIAGNOSIS — E559 Vitamin D deficiency, unspecified: Secondary | ICD-10-CM | POA: Diagnosis not present

## 2016-10-27 DIAGNOSIS — E785 Hyperlipidemia, unspecified: Secondary | ICD-10-CM | POA: Diagnosis not present

## 2016-10-27 DIAGNOSIS — E876 Hypokalemia: Secondary | ICD-10-CM | POA: Diagnosis not present

## 2016-10-27 DIAGNOSIS — R8299 Other abnormal findings in urine: Secondary | ICD-10-CM | POA: Diagnosis not present

## 2016-10-27 DIAGNOSIS — K219 Gastro-esophageal reflux disease without esophagitis: Secondary | ICD-10-CM | POA: Diagnosis not present

## 2016-11-07 DIAGNOSIS — R590 Localized enlarged lymph nodes: Secondary | ICD-10-CM | POA: Diagnosis not present

## 2016-11-07 DIAGNOSIS — J019 Acute sinusitis, unspecified: Secondary | ICD-10-CM | POA: Diagnosis not present

## 2016-11-07 DIAGNOSIS — E785 Hyperlipidemia, unspecified: Secondary | ICD-10-CM | POA: Diagnosis not present

## 2016-11-24 DIAGNOSIS — M542 Cervicalgia: Secondary | ICD-10-CM | POA: Diagnosis not present

## 2016-11-24 DIAGNOSIS — M47816 Spondylosis without myelopathy or radiculopathy, lumbar region: Secondary | ICD-10-CM | POA: Diagnosis not present

## 2016-11-24 DIAGNOSIS — G629 Polyneuropathy, unspecified: Secondary | ICD-10-CM | POA: Diagnosis not present

## 2016-11-24 DIAGNOSIS — G894 Chronic pain syndrome: Secondary | ICD-10-CM | POA: Diagnosis not present

## 2016-11-24 DIAGNOSIS — M797 Fibromyalgia: Secondary | ICD-10-CM | POA: Diagnosis not present

## 2016-11-24 DIAGNOSIS — C50919 Malignant neoplasm of unspecified site of unspecified female breast: Secondary | ICD-10-CM | POA: Diagnosis not present

## 2016-11-24 DIAGNOSIS — M5136 Other intervertebral disc degeneration, lumbar region: Secondary | ICD-10-CM | POA: Diagnosis not present

## 2016-11-24 DIAGNOSIS — G43909 Migraine, unspecified, not intractable, without status migrainosus: Secondary | ICD-10-CM | POA: Diagnosis not present

## 2016-12-20 DIAGNOSIS — M545 Low back pain: Secondary | ICD-10-CM | POA: Diagnosis not present

## 2016-12-20 DIAGNOSIS — M5146 Schmorl's nodes, lumbar region: Secondary | ICD-10-CM | POA: Diagnosis not present

## 2016-12-20 DIAGNOSIS — M5126 Other intervertebral disc displacement, lumbar region: Secondary | ICD-10-CM | POA: Diagnosis not present

## 2016-12-20 DIAGNOSIS — M5136 Other intervertebral disc degeneration, lumbar region: Secondary | ICD-10-CM | POA: Diagnosis not present

## 2016-12-23 DIAGNOSIS — M5136 Other intervertebral disc degeneration, lumbar region: Secondary | ICD-10-CM | POA: Diagnosis not present

## 2016-12-23 DIAGNOSIS — G43909 Migraine, unspecified, not intractable, without status migrainosus: Secondary | ICD-10-CM | POA: Diagnosis not present

## 2016-12-23 DIAGNOSIS — C50919 Malignant neoplasm of unspecified site of unspecified female breast: Secondary | ICD-10-CM | POA: Diagnosis not present

## 2016-12-23 DIAGNOSIS — M797 Fibromyalgia: Secondary | ICD-10-CM | POA: Diagnosis not present

## 2016-12-23 DIAGNOSIS — G629 Polyneuropathy, unspecified: Secondary | ICD-10-CM | POA: Diagnosis not present

## 2016-12-23 DIAGNOSIS — M542 Cervicalgia: Secondary | ICD-10-CM | POA: Diagnosis not present

## 2016-12-23 DIAGNOSIS — G894 Chronic pain syndrome: Secondary | ICD-10-CM | POA: Diagnosis not present

## 2016-12-23 DIAGNOSIS — M47816 Spondylosis without myelopathy or radiculopathy, lumbar region: Secondary | ICD-10-CM | POA: Diagnosis not present

## 2017-01-21 DIAGNOSIS — M5136 Other intervertebral disc degeneration, lumbar region: Secondary | ICD-10-CM | POA: Diagnosis not present

## 2017-01-21 DIAGNOSIS — G894 Chronic pain syndrome: Secondary | ICD-10-CM | POA: Diagnosis not present

## 2017-01-21 DIAGNOSIS — G629 Polyneuropathy, unspecified: Secondary | ICD-10-CM | POA: Diagnosis not present

## 2017-01-21 DIAGNOSIS — M47816 Spondylosis without myelopathy or radiculopathy, lumbar region: Secondary | ICD-10-CM | POA: Diagnosis not present

## 2017-01-21 DIAGNOSIS — C50919 Malignant neoplasm of unspecified site of unspecified female breast: Secondary | ICD-10-CM | POA: Diagnosis not present

## 2017-01-21 DIAGNOSIS — G43909 Migraine, unspecified, not intractable, without status migrainosus: Secondary | ICD-10-CM | POA: Diagnosis not present

## 2017-01-21 DIAGNOSIS — M797 Fibromyalgia: Secondary | ICD-10-CM | POA: Diagnosis not present

## 2017-01-21 DIAGNOSIS — M542 Cervicalgia: Secondary | ICD-10-CM | POA: Diagnosis not present

## 2017-01-22 DIAGNOSIS — M797 Fibromyalgia: Secondary | ICD-10-CM | POA: Diagnosis not present

## 2017-01-22 DIAGNOSIS — C50919 Malignant neoplasm of unspecified site of unspecified female breast: Secondary | ICD-10-CM | POA: Diagnosis not present

## 2017-01-22 DIAGNOSIS — G894 Chronic pain syndrome: Secondary | ICD-10-CM | POA: Diagnosis not present

## 2017-01-22 DIAGNOSIS — M542 Cervicalgia: Secondary | ICD-10-CM | POA: Diagnosis not present

## 2017-01-22 DIAGNOSIS — M5136 Other intervertebral disc degeneration, lumbar region: Secondary | ICD-10-CM | POA: Diagnosis not present

## 2017-01-22 DIAGNOSIS — M47816 Spondylosis without myelopathy or radiculopathy, lumbar region: Secondary | ICD-10-CM | POA: Diagnosis not present

## 2017-01-22 DIAGNOSIS — G43909 Migraine, unspecified, not intractable, without status migrainosus: Secondary | ICD-10-CM | POA: Diagnosis not present

## 2017-01-22 DIAGNOSIS — G629 Polyneuropathy, unspecified: Secondary | ICD-10-CM | POA: Diagnosis not present

## 2017-02-04 DIAGNOSIS — R05 Cough: Secondary | ICD-10-CM | POA: Diagnosis not present

## 2017-02-04 DIAGNOSIS — J019 Acute sinusitis, unspecified: Secondary | ICD-10-CM | POA: Diagnosis not present

## 2017-02-04 DIAGNOSIS — I209 Angina pectoris, unspecified: Secondary | ICD-10-CM | POA: Diagnosis not present

## 2017-02-04 DIAGNOSIS — Z1379 Encounter for other screening for genetic and chromosomal anomalies: Secondary | ICD-10-CM | POA: Diagnosis not present

## 2017-02-04 DIAGNOSIS — F339 Major depressive disorder, recurrent, unspecified: Secondary | ICD-10-CM | POA: Diagnosis not present

## 2017-02-04 DIAGNOSIS — F319 Bipolar disorder, unspecified: Secondary | ICD-10-CM | POA: Diagnosis not present

## 2017-02-04 DIAGNOSIS — E785 Hyperlipidemia, unspecified: Secondary | ICD-10-CM | POA: Diagnosis not present

## 2017-02-04 DIAGNOSIS — K219 Gastro-esophageal reflux disease without esophagitis: Secondary | ICD-10-CM | POA: Diagnosis not present

## 2017-02-04 DIAGNOSIS — Z136 Encounter for screening for cardiovascular disorders: Secondary | ICD-10-CM | POA: Diagnosis not present

## 2017-02-04 DIAGNOSIS — E876 Hypokalemia: Secondary | ICD-10-CM | POA: Diagnosis not present

## 2017-02-04 DIAGNOSIS — E559 Vitamin D deficiency, unspecified: Secondary | ICD-10-CM | POA: Diagnosis not present

## 2017-02-25 DIAGNOSIS — M47816 Spondylosis without myelopathy or radiculopathy, lumbar region: Secondary | ICD-10-CM | POA: Diagnosis not present

## 2017-02-25 DIAGNOSIS — G43909 Migraine, unspecified, not intractable, without status migrainosus: Secondary | ICD-10-CM | POA: Diagnosis not present

## 2017-02-25 DIAGNOSIS — M542 Cervicalgia: Secondary | ICD-10-CM | POA: Diagnosis not present

## 2017-02-25 DIAGNOSIS — M797 Fibromyalgia: Secondary | ICD-10-CM | POA: Diagnosis not present

## 2017-02-25 DIAGNOSIS — G894 Chronic pain syndrome: Secondary | ICD-10-CM | POA: Diagnosis not present

## 2017-02-25 DIAGNOSIS — C50919 Malignant neoplasm of unspecified site of unspecified female breast: Secondary | ICD-10-CM | POA: Diagnosis not present

## 2017-02-25 DIAGNOSIS — G629 Polyneuropathy, unspecified: Secondary | ICD-10-CM | POA: Diagnosis not present

## 2017-02-25 DIAGNOSIS — M5136 Other intervertebral disc degeneration, lumbar region: Secondary | ICD-10-CM | POA: Diagnosis not present

## 2017-03-12 DIAGNOSIS — R112 Nausea with vomiting, unspecified: Secondary | ICD-10-CM | POA: Diagnosis not present

## 2017-03-12 DIAGNOSIS — J0100 Acute maxillary sinusitis, unspecified: Secondary | ICD-10-CM | POA: Diagnosis not present

## 2017-03-12 DIAGNOSIS — R05 Cough: Secondary | ICD-10-CM | POA: Diagnosis not present

## 2017-04-01 DIAGNOSIS — R071 Chest pain on breathing: Secondary | ICD-10-CM | POA: Diagnosis not present

## 2017-04-01 DIAGNOSIS — R030 Elevated blood-pressure reading, without diagnosis of hypertension: Secondary | ICD-10-CM | POA: Diagnosis not present

## 2017-04-01 DIAGNOSIS — J019 Acute sinusitis, unspecified: Secondary | ICD-10-CM | POA: Diagnosis not present

## 2017-04-01 DIAGNOSIS — R079 Chest pain, unspecified: Secondary | ICD-10-CM | POA: Diagnosis not present

## 2017-04-01 DIAGNOSIS — R05 Cough: Secondary | ICD-10-CM | POA: Diagnosis not present

## 2017-04-07 DIAGNOSIS — R05 Cough: Secondary | ICD-10-CM | POA: Diagnosis not present

## 2017-04-07 DIAGNOSIS — R071 Chest pain on breathing: Secondary | ICD-10-CM | POA: Diagnosis not present

## 2017-04-08 DIAGNOSIS — R079 Chest pain, unspecified: Secondary | ICD-10-CM | POA: Diagnosis not present

## 2017-04-08 DIAGNOSIS — R05 Cough: Secondary | ICD-10-CM | POA: Diagnosis not present

## 2017-04-08 DIAGNOSIS — R071 Chest pain on breathing: Secondary | ICD-10-CM | POA: Diagnosis not present

## 2017-04-08 DIAGNOSIS — C50919 Malignant neoplasm of unspecified site of unspecified female breast: Secondary | ICD-10-CM | POA: Diagnosis not present

## 2017-04-08 DIAGNOSIS — M5136 Other intervertebral disc degeneration, lumbar region: Secondary | ICD-10-CM | POA: Diagnosis not present

## 2017-04-08 DIAGNOSIS — M47816 Spondylosis without myelopathy or radiculopathy, lumbar region: Secondary | ICD-10-CM | POA: Diagnosis not present

## 2017-04-08 DIAGNOSIS — G43909 Migraine, unspecified, not intractable, without status migrainosus: Secondary | ICD-10-CM | POA: Diagnosis not present

## 2017-04-08 DIAGNOSIS — G894 Chronic pain syndrome: Secondary | ICD-10-CM | POA: Diagnosis not present

## 2017-04-08 DIAGNOSIS — M797 Fibromyalgia: Secondary | ICD-10-CM | POA: Diagnosis not present

## 2017-04-08 DIAGNOSIS — G629 Polyneuropathy, unspecified: Secondary | ICD-10-CM | POA: Diagnosis not present

## 2017-04-08 DIAGNOSIS — M542 Cervicalgia: Secondary | ICD-10-CM | POA: Diagnosis not present

## 2017-04-13 DIAGNOSIS — Z1231 Encounter for screening mammogram for malignant neoplasm of breast: Secondary | ICD-10-CM | POA: Diagnosis not present

## 2017-04-17 DIAGNOSIS — Z853 Personal history of malignant neoplasm of breast: Secondary | ICD-10-CM | POA: Diagnosis not present

## 2017-04-17 DIAGNOSIS — Z809 Family history of malignant neoplasm, unspecified: Secondary | ICD-10-CM | POA: Diagnosis not present

## 2017-04-17 DIAGNOSIS — I890 Lymphedema, not elsewhere classified: Secondary | ICD-10-CM | POA: Diagnosis not present

## 2017-04-17 DIAGNOSIS — M940 Chondrocostal junction syndrome [Tietze]: Secondary | ICD-10-CM | POA: Diagnosis not present

## 2017-04-23 ENCOUNTER — Telehealth: Payer: Self-pay | Admitting: Oncology

## 2017-04-23 NOTE — Telephone Encounter (Signed)
04/23/17 successfully faxed Disability forms to [employer] @ [phone].  Called and spoke with patient @ [phone] @ 11:24 am informing her paperwork was faxed and a copy will be at front desk reception for pick-up at her convenience.

## 2017-04-29 DIAGNOSIS — M797 Fibromyalgia: Secondary | ICD-10-CM | POA: Diagnosis not present

## 2017-04-29 DIAGNOSIS — M5136 Other intervertebral disc degeneration, lumbar region: Secondary | ICD-10-CM | POA: Diagnosis not present

## 2017-04-29 DIAGNOSIS — G894 Chronic pain syndrome: Secondary | ICD-10-CM | POA: Diagnosis not present

## 2017-04-29 DIAGNOSIS — C50919 Malignant neoplasm of unspecified site of unspecified female breast: Secondary | ICD-10-CM | POA: Diagnosis not present

## 2017-04-29 DIAGNOSIS — M47816 Spondylosis without myelopathy or radiculopathy, lumbar region: Secondary | ICD-10-CM | POA: Diagnosis not present

## 2017-04-29 DIAGNOSIS — M542 Cervicalgia: Secondary | ICD-10-CM | POA: Diagnosis not present

## 2017-04-29 DIAGNOSIS — G629 Polyneuropathy, unspecified: Secondary | ICD-10-CM | POA: Diagnosis not present

## 2017-04-29 DIAGNOSIS — M9983 Other biomechanical lesions of lumbar region: Secondary | ICD-10-CM | POA: Diagnosis not present

## 2017-05-14 DIAGNOSIS — I10 Essential (primary) hypertension: Secondary | ICD-10-CM | POA: Diagnosis not present

## 2017-05-14 DIAGNOSIS — J019 Acute sinusitis, unspecified: Secondary | ICD-10-CM | POA: Diagnosis not present

## 2017-05-14 DIAGNOSIS — J309 Allergic rhinitis, unspecified: Secondary | ICD-10-CM | POA: Diagnosis not present

## 2017-05-28 DIAGNOSIS — G629 Polyneuropathy, unspecified: Secondary | ICD-10-CM | POA: Diagnosis not present

## 2017-05-28 DIAGNOSIS — M797 Fibromyalgia: Secondary | ICD-10-CM | POA: Diagnosis not present

## 2017-05-28 DIAGNOSIS — C50919 Malignant neoplasm of unspecified site of unspecified female breast: Secondary | ICD-10-CM | POA: Diagnosis not present

## 2017-05-28 DIAGNOSIS — M9983 Other biomechanical lesions of lumbar region: Secondary | ICD-10-CM | POA: Diagnosis not present

## 2017-05-28 DIAGNOSIS — M5136 Other intervertebral disc degeneration, lumbar region: Secondary | ICD-10-CM | POA: Diagnosis not present

## 2017-05-28 DIAGNOSIS — M542 Cervicalgia: Secondary | ICD-10-CM | POA: Diagnosis not present

## 2017-05-28 DIAGNOSIS — M47816 Spondylosis without myelopathy or radiculopathy, lumbar region: Secondary | ICD-10-CM | POA: Diagnosis not present

## 2017-05-28 DIAGNOSIS — G894 Chronic pain syndrome: Secondary | ICD-10-CM | POA: Diagnosis not present

## 2017-06-22 DIAGNOSIS — I10 Essential (primary) hypertension: Secondary | ICD-10-CM | POA: Diagnosis not present

## 2017-06-22 DIAGNOSIS — Z833 Family history of diabetes mellitus: Secondary | ICD-10-CM | POA: Diagnosis not present

## 2017-06-22 DIAGNOSIS — R040 Epistaxis: Secondary | ICD-10-CM | POA: Diagnosis not present

## 2017-06-22 DIAGNOSIS — R51 Headache: Secondary | ICD-10-CM | POA: Diagnosis not present

## 2017-06-22 DIAGNOSIS — E559 Vitamin D deficiency, unspecified: Secondary | ICD-10-CM | POA: Diagnosis not present

## 2017-06-22 DIAGNOSIS — J019 Acute sinusitis, unspecified: Secondary | ICD-10-CM | POA: Diagnosis not present

## 2017-06-22 DIAGNOSIS — E785 Hyperlipidemia, unspecified: Secondary | ICD-10-CM | POA: Diagnosis not present

## 2017-06-25 DIAGNOSIS — M797 Fibromyalgia: Secondary | ICD-10-CM | POA: Diagnosis not present

## 2017-06-25 DIAGNOSIS — M47816 Spondylosis without myelopathy or radiculopathy, lumbar region: Secondary | ICD-10-CM | POA: Diagnosis not present

## 2017-06-25 DIAGNOSIS — C50919 Malignant neoplasm of unspecified site of unspecified female breast: Secondary | ICD-10-CM | POA: Diagnosis not present

## 2017-06-25 DIAGNOSIS — G629 Polyneuropathy, unspecified: Secondary | ICD-10-CM | POA: Diagnosis not present

## 2017-06-25 DIAGNOSIS — M542 Cervicalgia: Secondary | ICD-10-CM | POA: Diagnosis not present

## 2017-06-25 DIAGNOSIS — M9983 Other biomechanical lesions of lumbar region: Secondary | ICD-10-CM | POA: Diagnosis not present

## 2017-06-25 DIAGNOSIS — G894 Chronic pain syndrome: Secondary | ICD-10-CM | POA: Diagnosis not present

## 2017-06-25 DIAGNOSIS — M5136 Other intervertebral disc degeneration, lumbar region: Secondary | ICD-10-CM | POA: Diagnosis not present

## 2017-06-30 DIAGNOSIS — I10 Essential (primary) hypertension: Secondary | ICD-10-CM | POA: Diagnosis not present

## 2017-07-06 DIAGNOSIS — I10 Essential (primary) hypertension: Secondary | ICD-10-CM | POA: Diagnosis not present

## 2017-07-07 DIAGNOSIS — R51 Headache: Secondary | ICD-10-CM | POA: Diagnosis not present

## 2017-07-07 DIAGNOSIS — E079 Disorder of thyroid, unspecified: Secondary | ICD-10-CM | POA: Diagnosis not present

## 2017-07-07 DIAGNOSIS — R42 Dizziness and giddiness: Secondary | ICD-10-CM | POA: Diagnosis not present

## 2017-07-08 DIAGNOSIS — R5383 Other fatigue: Secondary | ICD-10-CM | POA: Diagnosis not present

## 2017-07-08 DIAGNOSIS — E785 Hyperlipidemia, unspecified: Secondary | ICD-10-CM | POA: Diagnosis not present

## 2017-07-08 DIAGNOSIS — R51 Headache: Secondary | ICD-10-CM | POA: Diagnosis not present

## 2017-07-08 DIAGNOSIS — R002 Palpitations: Secondary | ICD-10-CM | POA: Diagnosis not present

## 2017-07-08 DIAGNOSIS — R531 Weakness: Secondary | ICD-10-CM | POA: Diagnosis not present

## 2017-07-13 DIAGNOSIS — J310 Chronic rhinitis: Secondary | ICD-10-CM | POA: Diagnosis not present

## 2017-07-13 DIAGNOSIS — J342 Deviated nasal septum: Secondary | ICD-10-CM | POA: Diagnosis not present

## 2017-07-13 DIAGNOSIS — R040 Epistaxis: Secondary | ICD-10-CM | POA: Diagnosis not present

## 2017-07-13 DIAGNOSIS — Z8709 Personal history of other diseases of the respiratory system: Secondary | ICD-10-CM | POA: Diagnosis not present

## 2017-07-23 DIAGNOSIS — M542 Cervicalgia: Secondary | ICD-10-CM | POA: Diagnosis not present

## 2017-07-23 DIAGNOSIS — G894 Chronic pain syndrome: Secondary | ICD-10-CM | POA: Diagnosis not present

## 2017-07-23 DIAGNOSIS — M5136 Other intervertebral disc degeneration, lumbar region: Secondary | ICD-10-CM | POA: Diagnosis not present

## 2017-07-23 DIAGNOSIS — M47816 Spondylosis without myelopathy or radiculopathy, lumbar region: Secondary | ICD-10-CM | POA: Diagnosis not present

## 2017-07-23 DIAGNOSIS — M9983 Other biomechanical lesions of lumbar region: Secondary | ICD-10-CM | POA: Diagnosis not present

## 2017-07-23 DIAGNOSIS — M797 Fibromyalgia: Secondary | ICD-10-CM | POA: Diagnosis not present

## 2017-07-23 DIAGNOSIS — C50919 Malignant neoplasm of unspecified site of unspecified female breast: Secondary | ICD-10-CM | POA: Diagnosis not present

## 2017-07-23 DIAGNOSIS — G629 Polyneuropathy, unspecified: Secondary | ICD-10-CM | POA: Diagnosis not present

## 2017-08-03 DIAGNOSIS — R42 Dizziness and giddiness: Secondary | ICD-10-CM | POA: Diagnosis not present

## 2017-08-03 DIAGNOSIS — I6523 Occlusion and stenosis of bilateral carotid arteries: Secondary | ICD-10-CM | POA: Diagnosis not present

## 2017-08-18 DIAGNOSIS — E6609 Other obesity due to excess calories: Secondary | ICD-10-CM | POA: Diagnosis not present

## 2017-08-18 DIAGNOSIS — E785 Hyperlipidemia, unspecified: Secondary | ICD-10-CM | POA: Diagnosis not present

## 2017-08-18 DIAGNOSIS — G4733 Obstructive sleep apnea (adult) (pediatric): Secondary | ICD-10-CM | POA: Diagnosis not present

## 2017-08-18 DIAGNOSIS — R002 Palpitations: Secondary | ICD-10-CM | POA: Diagnosis not present

## 2017-08-18 DIAGNOSIS — R0789 Other chest pain: Secondary | ICD-10-CM | POA: Diagnosis not present

## 2017-08-20 DIAGNOSIS — M542 Cervicalgia: Secondary | ICD-10-CM | POA: Diagnosis not present

## 2017-08-20 DIAGNOSIS — G629 Polyneuropathy, unspecified: Secondary | ICD-10-CM | POA: Diagnosis not present

## 2017-08-20 DIAGNOSIS — M5136 Other intervertebral disc degeneration, lumbar region: Secondary | ICD-10-CM | POA: Diagnosis not present

## 2017-08-20 DIAGNOSIS — G894 Chronic pain syndrome: Secondary | ICD-10-CM | POA: Diagnosis not present

## 2017-08-20 DIAGNOSIS — M9983 Other biomechanical lesions of lumbar region: Secondary | ICD-10-CM | POA: Diagnosis not present

## 2017-08-20 DIAGNOSIS — M47816 Spondylosis without myelopathy or radiculopathy, lumbar region: Secondary | ICD-10-CM | POA: Diagnosis not present

## 2017-08-20 DIAGNOSIS — M797 Fibromyalgia: Secondary | ICD-10-CM | POA: Diagnosis not present

## 2017-08-20 DIAGNOSIS — C50919 Malignant neoplasm of unspecified site of unspecified female breast: Secondary | ICD-10-CM | POA: Diagnosis not present

## 2017-08-26 DIAGNOSIS — M47816 Spondylosis without myelopathy or radiculopathy, lumbar region: Secondary | ICD-10-CM | POA: Diagnosis not present

## 2017-08-26 DIAGNOSIS — M542 Cervicalgia: Secondary | ICD-10-CM | POA: Diagnosis not present

## 2017-08-26 DIAGNOSIS — G894 Chronic pain syndrome: Secondary | ICD-10-CM | POA: Diagnosis not present

## 2017-08-26 DIAGNOSIS — M9983 Other biomechanical lesions of lumbar region: Secondary | ICD-10-CM | POA: Diagnosis not present

## 2017-08-26 DIAGNOSIS — G629 Polyneuropathy, unspecified: Secondary | ICD-10-CM | POA: Diagnosis not present

## 2017-08-26 DIAGNOSIS — C50919 Malignant neoplasm of unspecified site of unspecified female breast: Secondary | ICD-10-CM | POA: Diagnosis not present

## 2017-08-26 DIAGNOSIS — M797 Fibromyalgia: Secondary | ICD-10-CM | POA: Diagnosis not present

## 2017-08-26 DIAGNOSIS — M5136 Other intervertebral disc degeneration, lumbar region: Secondary | ICD-10-CM | POA: Diagnosis not present

## 2017-09-14 DIAGNOSIS — J019 Acute sinusitis, unspecified: Secondary | ICD-10-CM | POA: Diagnosis not present

## 2017-09-14 DIAGNOSIS — I10 Essential (primary) hypertension: Secondary | ICD-10-CM | POA: Diagnosis not present

## 2017-09-14 DIAGNOSIS — R51 Headache: Secondary | ICD-10-CM | POA: Diagnosis not present

## 2017-09-17 DIAGNOSIS — M5090 Cervical disc disorder, unspecified, unspecified cervical region: Secondary | ICD-10-CM | POA: Diagnosis not present

## 2017-09-17 DIAGNOSIS — M47816 Spondylosis without myelopathy or radiculopathy, lumbar region: Secondary | ICD-10-CM | POA: Diagnosis not present

## 2017-09-17 DIAGNOSIS — M542 Cervicalgia: Secondary | ICD-10-CM | POA: Diagnosis not present

## 2017-09-17 DIAGNOSIS — M9983 Other biomechanical lesions of lumbar region: Secondary | ICD-10-CM | POA: Diagnosis not present

## 2017-09-17 DIAGNOSIS — M5136 Other intervertebral disc degeneration, lumbar region: Secondary | ICD-10-CM | POA: Diagnosis not present

## 2017-09-17 DIAGNOSIS — G894 Chronic pain syndrome: Secondary | ICD-10-CM | POA: Diagnosis not present

## 2017-09-17 DIAGNOSIS — C50919 Malignant neoplasm of unspecified site of unspecified female breast: Secondary | ICD-10-CM | POA: Diagnosis not present

## 2017-09-17 DIAGNOSIS — G629 Polyneuropathy, unspecified: Secondary | ICD-10-CM | POA: Diagnosis not present

## 2017-09-23 DIAGNOSIS — M5417 Radiculopathy, lumbosacral region: Secondary | ICD-10-CM | POA: Diagnosis not present

## 2017-10-07 DIAGNOSIS — R3989 Other symptoms and signs involving the genitourinary system: Secondary | ICD-10-CM | POA: Diagnosis not present

## 2017-10-07 DIAGNOSIS — R1084 Generalized abdominal pain: Secondary | ICD-10-CM | POA: Diagnosis not present

## 2017-10-07 DIAGNOSIS — M5417 Radiculopathy, lumbosacral region: Secondary | ICD-10-CM | POA: Diagnosis not present

## 2017-10-13 ENCOUNTER — Encounter: Payer: Self-pay | Admitting: Neurology

## 2017-10-13 ENCOUNTER — Other Ambulatory Visit: Payer: Self-pay

## 2017-10-13 ENCOUNTER — Ambulatory Visit: Payer: Medicare HMO | Admitting: Neurology

## 2017-10-13 VITALS — BP 146/78 | HR 68 | Ht 61.0 in | Wt 194.5 lb

## 2017-10-13 DIAGNOSIS — R413 Other amnesia: Secondary | ICD-10-CM

## 2017-10-13 DIAGNOSIS — G4733 Obstructive sleep apnea (adult) (pediatric): Secondary | ICD-10-CM

## 2017-10-13 MED ORDER — VENLAFAXINE HCL ER 37.5 MG PO CP24: ORAL_CAPSULE | ORAL | 2 refills | Status: DC

## 2017-10-13 MED ORDER — SUMATRIPTAN SUCCINATE 6 MG/0.5ML ~~LOC~~ SOSY: 6.0000 mg | PREFILLED_SYRINGE | Freq: Two times a day (BID) | SUBCUTANEOUS | 4 refills | Status: DC | PRN
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
## 2017-10-13 NOTE — Patient Instructions (Signed)
   We will get a sleep referral, we will start Effexor for the headache, take imitrex if needed.

## 2017-10-14 ENCOUNTER — Telehealth: Payer: Self-pay | Admitting: *Deleted

## 2017-10-14 LAB — SEDIMENTATION RATE: Sed Rate: 64 mm/hr — ABNORMAL HIGH (ref 0–32)

## 2017-10-14 LAB — HIV ANTIBODY (ROUTINE TESTING W REFLEX): HIV SCREEN 4TH GENERATION: NONREACTIVE

## 2017-10-14 LAB — VITAMIN B12: VITAMIN B 12: 280 pg/mL (ref 232–1245)

## 2017-10-14 LAB — RPR: RPR Ser Ql: NONREACTIVE

## 2017-10-14 NOTE — Telephone Encounter (Signed)
Called and left detailed message (ok per DPR) about lab results per CW,MD note. Gave [facility] phone number if she has further questions/concerns.

## 2017-10-14 NOTE — Telephone Encounter (Signed)
-----   Message from Kathrynn Ducking, MD sent at 10/14/2017  7:17 AM EDT -----  Blood work is unremarkable with exception of an elevation in the sedimentation rate, this will need to be followed over time.  Sedimentation rate is nonspecific measure of inflammation in the body. Please call the patient. ----- Message ----- From: Lavone Neri Lab Results In Sent: 10/14/2017   5:39 AM To: Kathrynn Ducking, MD

## 2017-10-16 DIAGNOSIS — M5136 Other intervertebral disc degeneration, lumbar region: Secondary | ICD-10-CM | POA: Diagnosis not present

## 2017-10-16 DIAGNOSIS — M47816 Spondylosis without myelopathy or radiculopathy, lumbar region: Secondary | ICD-10-CM | POA: Diagnosis not present

## 2017-10-16 DIAGNOSIS — G894 Chronic pain syndrome: Secondary | ICD-10-CM | POA: Diagnosis not present

## 2017-10-16 DIAGNOSIS — M542 Cervicalgia: Secondary | ICD-10-CM | POA: Diagnosis not present

## 2017-10-16 DIAGNOSIS — C50919 Malignant neoplasm of unspecified site of unspecified female breast: Secondary | ICD-10-CM | POA: Diagnosis not present

## 2017-10-16 DIAGNOSIS — M5090 Cervical disc disorder, unspecified, unspecified cervical region: Secondary | ICD-10-CM | POA: Diagnosis not present

## 2017-10-16 DIAGNOSIS — G629 Polyneuropathy, unspecified: Secondary | ICD-10-CM | POA: Diagnosis not present

## 2017-10-16 DIAGNOSIS — M9983 Other biomechanical lesions of lumbar region: Secondary | ICD-10-CM | POA: Diagnosis not present

## 2017-10-22 DIAGNOSIS — R81 Glycosuria: Secondary | ICD-10-CM | POA: Diagnosis not present

## 2017-10-22 DIAGNOSIS — N3946 Mixed incontinence: Secondary | ICD-10-CM | POA: Diagnosis not present

## 2017-10-22 DIAGNOSIS — R102 Pelvic and perineal pain: Secondary | ICD-10-CM | POA: Diagnosis not present

## 2017-10-22 DIAGNOSIS — R339 Retention of urine, unspecified: Secondary | ICD-10-CM | POA: Diagnosis not present

## 2017-10-22 DIAGNOSIS — R1031 Right lower quadrant pain: Secondary | ICD-10-CM | POA: Diagnosis not present

## 2017-10-22 DIAGNOSIS — Z79899 Other long term (current) drug therapy: Secondary | ICD-10-CM | POA: Diagnosis not present

## 2017-10-23 DIAGNOSIS — R109 Unspecified abdominal pain: Secondary | ICD-10-CM | POA: Diagnosis not present

## 2017-10-23 DIAGNOSIS — R1084 Generalized abdominal pain: Secondary | ICD-10-CM | POA: Diagnosis not present

## 2017-10-24 DIAGNOSIS — M5090 Cervical disc disorder, unspecified, unspecified cervical region: Secondary | ICD-10-CM | POA: Diagnosis not present

## 2017-10-24 DIAGNOSIS — M50223 Other cervical disc displacement at C6-C7 level: Secondary | ICD-10-CM | POA: Diagnosis not present

## 2017-10-24 DIAGNOSIS — M50222 Other cervical disc displacement at C5-C6 level: Secondary | ICD-10-CM | POA: Diagnosis not present

## 2017-10-30 DIAGNOSIS — R5383 Other fatigue: Secondary | ICD-10-CM | POA: Diagnosis not present

## 2017-10-30 DIAGNOSIS — R935 Abnormal findings on diagnostic imaging of other abdominal regions, including retroperitoneum: Secondary | ICD-10-CM | POA: Diagnosis not present

## 2017-10-30 DIAGNOSIS — Z131 Encounter for screening for diabetes mellitus: Secondary | ICD-10-CM | POA: Diagnosis not present

## 2017-11-11 DIAGNOSIS — G629 Polyneuropathy, unspecified: Secondary | ICD-10-CM | POA: Diagnosis not present

## 2017-11-11 DIAGNOSIS — G894 Chronic pain syndrome: Secondary | ICD-10-CM | POA: Diagnosis not present

## 2017-11-11 DIAGNOSIS — M9983 Other biomechanical lesions of lumbar region: Secondary | ICD-10-CM | POA: Diagnosis not present

## 2017-11-11 DIAGNOSIS — M542 Cervicalgia: Secondary | ICD-10-CM | POA: Diagnosis not present

## 2017-11-11 DIAGNOSIS — M47816 Spondylosis without myelopathy or radiculopathy, lumbar region: Secondary | ICD-10-CM | POA: Diagnosis not present

## 2017-11-11 DIAGNOSIS — M5090 Cervical disc disorder, unspecified, unspecified cervical region: Secondary | ICD-10-CM | POA: Diagnosis not present

## 2017-11-11 DIAGNOSIS — C50919 Malignant neoplasm of unspecified site of unspecified female breast: Secondary | ICD-10-CM | POA: Diagnosis not present

## 2017-11-11 DIAGNOSIS — M5136 Other intervertebral disc degeneration, lumbar region: Secondary | ICD-10-CM | POA: Diagnosis not present

## 2017-11-27 DIAGNOSIS — J019 Acute sinusitis, unspecified: Secondary | ICD-10-CM | POA: Diagnosis not present

## 2017-11-27 DIAGNOSIS — I10 Essential (primary) hypertension: Secondary | ICD-10-CM | POA: Diagnosis not present

## 2017-12-11 DIAGNOSIS — C50919 Malignant neoplasm of unspecified site of unspecified female breast: Secondary | ICD-10-CM | POA: Diagnosis not present

## 2017-12-11 DIAGNOSIS — M47816 Spondylosis without myelopathy or radiculopathy, lumbar region: Secondary | ICD-10-CM | POA: Diagnosis not present

## 2017-12-11 DIAGNOSIS — G629 Polyneuropathy, unspecified: Secondary | ICD-10-CM | POA: Diagnosis not present

## 2017-12-11 DIAGNOSIS — M9983 Other biomechanical lesions of lumbar region: Secondary | ICD-10-CM | POA: Diagnosis not present

## 2017-12-11 DIAGNOSIS — M5136 Other intervertebral disc degeneration, lumbar region: Secondary | ICD-10-CM | POA: Diagnosis not present

## 2017-12-11 DIAGNOSIS — M5090 Cervical disc disorder, unspecified, unspecified cervical region: Secondary | ICD-10-CM | POA: Diagnosis not present

## 2017-12-11 DIAGNOSIS — G894 Chronic pain syndrome: Secondary | ICD-10-CM | POA: Diagnosis not present

## 2017-12-11 DIAGNOSIS — M542 Cervicalgia: Secondary | ICD-10-CM | POA: Diagnosis not present

## 2017-12-15 DIAGNOSIS — Z1211 Encounter for screening for malignant neoplasm of colon: Secondary | ICD-10-CM | POA: Diagnosis not present

## 2017-12-15 DIAGNOSIS — R1031 Right lower quadrant pain: Secondary | ICD-10-CM | POA: Diagnosis not present

## 2017-12-30 DIAGNOSIS — M542 Cervicalgia: Secondary | ICD-10-CM | POA: Diagnosis not present

## 2017-12-30 DIAGNOSIS — M5136 Other intervertebral disc degeneration, lumbar region: Secondary | ICD-10-CM | POA: Diagnosis not present

## 2017-12-30 DIAGNOSIS — M5090 Cervical disc disorder, unspecified, unspecified cervical region: Secondary | ICD-10-CM | POA: Diagnosis not present

## 2017-12-30 DIAGNOSIS — G629 Polyneuropathy, unspecified: Secondary | ICD-10-CM | POA: Diagnosis not present

## 2017-12-30 DIAGNOSIS — G894 Chronic pain syndrome: Secondary | ICD-10-CM | POA: Diagnosis not present

## 2017-12-30 DIAGNOSIS — M47816 Spondylosis without myelopathy or radiculopathy, lumbar region: Secondary | ICD-10-CM | POA: Diagnosis not present

## 2017-12-30 DIAGNOSIS — M9983 Other biomechanical lesions of lumbar region: Secondary | ICD-10-CM | POA: Diagnosis not present

## 2017-12-30 DIAGNOSIS — C50919 Malignant neoplasm of unspecified site of unspecified female breast: Secondary | ICD-10-CM | POA: Diagnosis not present

## 2018-01-06 DIAGNOSIS — Z1211 Encounter for screening for malignant neoplasm of colon: Secondary | ICD-10-CM | POA: Diagnosis not present

## 2018-01-18 DIAGNOSIS — C50919 Malignant neoplasm of unspecified site of unspecified female breast: Secondary | ICD-10-CM | POA: Diagnosis not present

## 2018-01-18 DIAGNOSIS — G894 Chronic pain syndrome: Secondary | ICD-10-CM | POA: Diagnosis not present

## 2018-01-18 DIAGNOSIS — M47816 Spondylosis without myelopathy or radiculopathy, lumbar region: Secondary | ICD-10-CM | POA: Diagnosis not present

## 2018-01-18 DIAGNOSIS — M5090 Cervical disc disorder, unspecified, unspecified cervical region: Secondary | ICD-10-CM | POA: Diagnosis not present

## 2018-01-18 DIAGNOSIS — M5136 Other intervertebral disc degeneration, lumbar region: Secondary | ICD-10-CM | POA: Diagnosis not present

## 2018-01-18 DIAGNOSIS — M9983 Other biomechanical lesions of lumbar region: Secondary | ICD-10-CM | POA: Diagnosis not present

## 2018-01-18 DIAGNOSIS — G629 Polyneuropathy, unspecified: Secondary | ICD-10-CM | POA: Diagnosis not present

## 2018-01-18 DIAGNOSIS — M542 Cervicalgia: Secondary | ICD-10-CM | POA: Diagnosis not present

## 2018-02-16 ENCOUNTER — Ambulatory Visit: Payer: Medicare HMO | Admitting: Neurology

## 2018-02-17 DIAGNOSIS — M5090 Cervical disc disorder, unspecified, unspecified cervical region: Secondary | ICD-10-CM | POA: Diagnosis not present

## 2018-02-17 DIAGNOSIS — G629 Polyneuropathy, unspecified: Secondary | ICD-10-CM | POA: Diagnosis not present

## 2018-02-17 DIAGNOSIS — M47816 Spondylosis without myelopathy or radiculopathy, lumbar region: Secondary | ICD-10-CM | POA: Diagnosis not present

## 2018-02-17 DIAGNOSIS — J45909 Unspecified asthma, uncomplicated: Secondary | ICD-10-CM | POA: Diagnosis not present

## 2018-02-17 DIAGNOSIS — E785 Hyperlipidemia, unspecified: Secondary | ICD-10-CM | POA: Diagnosis not present

## 2018-02-17 DIAGNOSIS — M542 Cervicalgia: Secondary | ICD-10-CM | POA: Diagnosis not present

## 2018-02-17 DIAGNOSIS — J329 Chronic sinusitis, unspecified: Secondary | ICD-10-CM | POA: Diagnosis not present

## 2018-02-17 DIAGNOSIS — M9983 Other biomechanical lesions of lumbar region: Secondary | ICD-10-CM | POA: Diagnosis not present

## 2018-02-17 DIAGNOSIS — M5136 Other intervertebral disc degeneration, lumbar region: Secondary | ICD-10-CM | POA: Diagnosis not present

## 2018-02-17 DIAGNOSIS — C50919 Malignant neoplasm of unspecified site of unspecified female breast: Secondary | ICD-10-CM | POA: Diagnosis not present

## 2018-02-17 DIAGNOSIS — J309 Allergic rhinitis, unspecified: Secondary | ICD-10-CM | POA: Diagnosis not present

## 2018-02-17 DIAGNOSIS — G894 Chronic pain syndrome: Secondary | ICD-10-CM | POA: Diagnosis not present

## 2018-03-10 DIAGNOSIS — Z1272 Encounter for screening for malignant neoplasm of vagina: Secondary | ICD-10-CM | POA: Diagnosis not present

## 2018-03-10 DIAGNOSIS — Z01419 Encounter for gynecological examination (general) (routine) without abnormal findings: Secondary | ICD-10-CM | POA: Diagnosis not present

## 2018-03-15 DIAGNOSIS — M5136 Other intervertebral disc degeneration, lumbar region: Secondary | ICD-10-CM | POA: Diagnosis not present

## 2018-03-15 DIAGNOSIS — M47816 Spondylosis without myelopathy or radiculopathy, lumbar region: Secondary | ICD-10-CM | POA: Diagnosis not present

## 2018-03-15 DIAGNOSIS — G894 Chronic pain syndrome: Secondary | ICD-10-CM | POA: Diagnosis not present

## 2018-03-15 DIAGNOSIS — M797 Fibromyalgia: Secondary | ICD-10-CM | POA: Diagnosis not present

## 2018-03-15 DIAGNOSIS — C50919 Malignant neoplasm of unspecified site of unspecified female breast: Secondary | ICD-10-CM | POA: Diagnosis not present

## 2018-03-15 DIAGNOSIS — M9983 Other biomechanical lesions of lumbar region: Secondary | ICD-10-CM | POA: Diagnosis not present

## 2018-03-15 DIAGNOSIS — M5090 Cervical disc disorder, unspecified, unspecified cervical region: Secondary | ICD-10-CM | POA: Diagnosis not present

## 2018-03-15 DIAGNOSIS — G629 Polyneuropathy, unspecified: Secondary | ICD-10-CM | POA: Diagnosis not present

## 2018-03-18 DIAGNOSIS — J343 Hypertrophy of nasal turbinates: Secondary | ICD-10-CM | POA: Diagnosis not present

## 2018-03-18 DIAGNOSIS — R0981 Nasal congestion: Secondary | ICD-10-CM | POA: Diagnosis not present

## 2018-03-18 DIAGNOSIS — J329 Chronic sinusitis, unspecified: Secondary | ICD-10-CM | POA: Diagnosis not present

## 2018-03-18 DIAGNOSIS — J3489 Other specified disorders of nose and nasal sinuses: Secondary | ICD-10-CM | POA: Diagnosis not present

## 2018-03-18 DIAGNOSIS — J342 Deviated nasal septum: Secondary | ICD-10-CM | POA: Diagnosis not present

## 2018-04-14 DIAGNOSIS — M5136 Other intervertebral disc degeneration, lumbar region: Secondary | ICD-10-CM | POA: Diagnosis not present

## 2018-04-14 DIAGNOSIS — G894 Chronic pain syndrome: Secondary | ICD-10-CM | POA: Diagnosis not present

## 2018-04-14 DIAGNOSIS — M9983 Other biomechanical lesions of lumbar region: Secondary | ICD-10-CM | POA: Diagnosis not present

## 2018-04-14 DIAGNOSIS — C50919 Malignant neoplasm of unspecified site of unspecified female breast: Secondary | ICD-10-CM | POA: Diagnosis not present

## 2018-04-14 DIAGNOSIS — M5090 Cervical disc disorder, unspecified, unspecified cervical region: Secondary | ICD-10-CM | POA: Diagnosis not present

## 2018-04-14 DIAGNOSIS — G629 Polyneuropathy, unspecified: Secondary | ICD-10-CM | POA: Diagnosis not present

## 2018-04-14 DIAGNOSIS — M47816 Spondylosis without myelopathy or radiculopathy, lumbar region: Secondary | ICD-10-CM | POA: Diagnosis not present

## 2018-04-14 DIAGNOSIS — M797 Fibromyalgia: Secondary | ICD-10-CM | POA: Diagnosis not present

## 2018-04-15 DIAGNOSIS — Z1231 Encounter for screening mammogram for malignant neoplasm of breast: Secondary | ICD-10-CM | POA: Diagnosis not present

## 2018-04-23 DIAGNOSIS — E876 Hypokalemia: Secondary | ICD-10-CM | POA: Diagnosis not present

## 2018-04-23 DIAGNOSIS — Z853 Personal history of malignant neoplasm of breast: Secondary | ICD-10-CM | POA: Diagnosis not present

## 2018-04-23 DIAGNOSIS — M940 Chondrocostal junction syndrome [Tietze]: Secondary | ICD-10-CM | POA: Diagnosis not present

## 2018-05-19 DIAGNOSIS — Z23 Encounter for immunization: Secondary | ICD-10-CM | POA: Diagnosis not present

## 2018-05-19 DIAGNOSIS — R140 Abdominal distension (gaseous): Secondary | ICD-10-CM | POA: Diagnosis not present

## 2018-05-19 DIAGNOSIS — M545 Low back pain: Secondary | ICD-10-CM | POA: Diagnosis not present

## 2018-05-19 DIAGNOSIS — I10 Essential (primary) hypertension: Secondary | ICD-10-CM | POA: Diagnosis not present

## 2018-05-19 DIAGNOSIS — R1084 Generalized abdominal pain: Secondary | ICD-10-CM | POA: Diagnosis not present

## 2018-05-19 DIAGNOSIS — E785 Hyperlipidemia, unspecified: Secondary | ICD-10-CM | POA: Diagnosis not present

## 2018-05-20 DIAGNOSIS — R1084 Generalized abdominal pain: Secondary | ICD-10-CM | POA: Diagnosis not present

## 2018-05-20 DIAGNOSIS — M5416 Radiculopathy, lumbar region: Secondary | ICD-10-CM | POA: Diagnosis not present

## 2018-05-20 DIAGNOSIS — R52 Pain, unspecified: Secondary | ICD-10-CM | POA: Diagnosis not present

## 2018-05-20 DIAGNOSIS — M47814 Spondylosis without myelopathy or radiculopathy, thoracic region: Secondary | ICD-10-CM | POA: Diagnosis not present

## 2018-05-20 DIAGNOSIS — R109 Unspecified abdominal pain: Secondary | ICD-10-CM | POA: Diagnosis not present

## 2018-05-25 DIAGNOSIS — M25551 Pain in right hip: Secondary | ICD-10-CM | POA: Diagnosis not present

## 2018-05-25 DIAGNOSIS — I10 Essential (primary) hypertension: Secondary | ICD-10-CM | POA: Diagnosis not present

## 2018-05-25 DIAGNOSIS — E785 Hyperlipidemia, unspecified: Secondary | ICD-10-CM | POA: Diagnosis not present

## 2018-05-27 DIAGNOSIS — M25551 Pain in right hip: Secondary | ICD-10-CM | POA: Diagnosis not present

## 2018-06-01 DIAGNOSIS — M5416 Radiculopathy, lumbar region: Secondary | ICD-10-CM | POA: Diagnosis not present

## 2018-06-01 DIAGNOSIS — M545 Low back pain: Secondary | ICD-10-CM | POA: Diagnosis not present

## 2018-06-10 DIAGNOSIS — M47816 Spondylosis without myelopathy or radiculopathy, lumbar region: Secondary | ICD-10-CM | POA: Diagnosis not present

## 2018-06-10 DIAGNOSIS — M797 Fibromyalgia: Secondary | ICD-10-CM | POA: Diagnosis not present

## 2018-06-10 DIAGNOSIS — M5136 Other intervertebral disc degeneration, lumbar region: Secondary | ICD-10-CM | POA: Diagnosis not present

## 2018-06-10 DIAGNOSIS — M9983 Other biomechanical lesions of lumbar region: Secondary | ICD-10-CM | POA: Diagnosis not present

## 2018-06-10 DIAGNOSIS — G629 Polyneuropathy, unspecified: Secondary | ICD-10-CM | POA: Diagnosis not present

## 2018-06-10 DIAGNOSIS — G894 Chronic pain syndrome: Secondary | ICD-10-CM | POA: Diagnosis not present

## 2018-06-10 DIAGNOSIS — C50919 Malignant neoplasm of unspecified site of unspecified female breast: Secondary | ICD-10-CM | POA: Diagnosis not present

## 2018-06-10 DIAGNOSIS — M5090 Cervical disc disorder, unspecified, unspecified cervical region: Secondary | ICD-10-CM | POA: Diagnosis not present

## 2018-07-07 DIAGNOSIS — M47816 Spondylosis without myelopathy or radiculopathy, lumbar region: Secondary | ICD-10-CM | POA: Diagnosis not present

## 2018-07-07 DIAGNOSIS — M9983 Other biomechanical lesions of lumbar region: Secondary | ICD-10-CM | POA: Diagnosis not present

## 2018-07-07 DIAGNOSIS — M797 Fibromyalgia: Secondary | ICD-10-CM | POA: Diagnosis not present

## 2018-07-07 DIAGNOSIS — M5090 Cervical disc disorder, unspecified, unspecified cervical region: Secondary | ICD-10-CM | POA: Diagnosis not present

## 2018-07-07 DIAGNOSIS — M5136 Other intervertebral disc degeneration, lumbar region: Secondary | ICD-10-CM | POA: Diagnosis not present

## 2018-07-07 DIAGNOSIS — G629 Polyneuropathy, unspecified: Secondary | ICD-10-CM | POA: Diagnosis not present

## 2018-07-07 DIAGNOSIS — C50919 Malignant neoplasm of unspecified site of unspecified female breast: Secondary | ICD-10-CM | POA: Diagnosis not present

## 2018-07-07 DIAGNOSIS — G894 Chronic pain syndrome: Secondary | ICD-10-CM | POA: Diagnosis not present

## 2018-08-11 DIAGNOSIS — G894 Chronic pain syndrome: Secondary | ICD-10-CM | POA: Diagnosis not present

## 2018-08-11 DIAGNOSIS — M9983 Other biomechanical lesions of lumbar region: Secondary | ICD-10-CM | POA: Diagnosis not present

## 2018-08-11 DIAGNOSIS — M47816 Spondylosis without myelopathy or radiculopathy, lumbar region: Secondary | ICD-10-CM | POA: Diagnosis not present

## 2018-08-11 DIAGNOSIS — C50919 Malignant neoplasm of unspecified site of unspecified female breast: Secondary | ICD-10-CM | POA: Diagnosis not present

## 2018-08-11 DIAGNOSIS — M5090 Cervical disc disorder, unspecified, unspecified cervical region: Secondary | ICD-10-CM | POA: Diagnosis not present

## 2018-08-11 DIAGNOSIS — M5136 Other intervertebral disc degeneration, lumbar region: Secondary | ICD-10-CM | POA: Diagnosis not present

## 2018-08-11 DIAGNOSIS — M797 Fibromyalgia: Secondary | ICD-10-CM | POA: Diagnosis not present

## 2018-08-11 DIAGNOSIS — G629 Polyneuropathy, unspecified: Secondary | ICD-10-CM | POA: Diagnosis not present

## 2018-09-09 DIAGNOSIS — M5090 Cervical disc disorder, unspecified, unspecified cervical region: Secondary | ICD-10-CM | POA: Diagnosis not present

## 2018-09-09 DIAGNOSIS — G894 Chronic pain syndrome: Secondary | ICD-10-CM | POA: Diagnosis not present

## 2018-09-09 DIAGNOSIS — M47816 Spondylosis without myelopathy or radiculopathy, lumbar region: Secondary | ICD-10-CM | POA: Diagnosis not present

## 2018-09-09 DIAGNOSIS — M5136 Other intervertebral disc degeneration, lumbar region: Secondary | ICD-10-CM | POA: Diagnosis not present

## 2018-09-09 DIAGNOSIS — G629 Polyneuropathy, unspecified: Secondary | ICD-10-CM | POA: Diagnosis not present

## 2018-09-09 DIAGNOSIS — M9983 Other biomechanical lesions of lumbar region: Secondary | ICD-10-CM | POA: Diagnosis not present

## 2018-09-09 DIAGNOSIS — M797 Fibromyalgia: Secondary | ICD-10-CM | POA: Diagnosis not present

## 2018-09-09 DIAGNOSIS — C50919 Malignant neoplasm of unspecified site of unspecified female breast: Secondary | ICD-10-CM | POA: Diagnosis not present

## 2019-04-22 DIAGNOSIS — Z853 Personal history of malignant neoplasm of breast: Secondary | ICD-10-CM | POA: Diagnosis not present

## 2019-09-22 ENCOUNTER — Ambulatory Visit: Payer: Medicare HMO | Admitting: Cardiology

## 2019-10-06 ENCOUNTER — Encounter: Payer: Self-pay | Admitting: Cardiology

## 2019-10-06 NOTE — Progress Notes (Signed)
Cardiology Office Note   Date:  10/07/2019   ID:  [id number] [place], [place] 02/02/1971, MRN [id number]  PCP:  Imagene Riches, NP  Cardiologist:   No primary care provider on file. Referring:  Imagene Riches, NP  Chief Complaint  Patient presents with  . Hypertension      History of Present Illness: Danielle Contreras is a 49 y.o. female  who presents for evaluation of HTN. I saw her in 2012 and 2016 for chest pain. She had a negative stress test in 2012.  Who presents for difficult to control hypertension.  She recently had blood pressures in the 170s over 100s.  I reviewed some outpatient office records that confirmed this.  She was started on metoprolol.  She was also started on amlodipine.  Her blood pressure is now well controlled according to the readings today.  Is not entirely clear that they have been completely well controlled at home as her cuff is a wrist cuff and may be not accurate.  She is not having any acute cardiovascular symptoms such as chest pressure, neck or arm breath, PND or orthopnea.  Has had no new palpitations, presyncope or syncope.  Her biggest issue is sleep apnea and her CPAP does not work.  She is going to have a new sleep study.  She has severe daytime somnolence.  Some of this is exacerbated by her meds.  She has problems with her sleep-wake cycle.  This really limits her doing anything and getting out.    Past Medical History:  Diagnosis Date  . Anxiety   . Asthma   . Bipolar 1 disorder ([place])   . Breast cancer ([place])   . Depression   . Fibrocystic breast   . Fibromyalgia   . Gastritis   . GERD (gastroesophageal reflux disease)   . Headache(784.0)   . Heel spur    RIGHT  . Hyperlipidemia   . IBS (irritable bowel syndrome)   . Lymphedema    right arm  . Migraines   . OSA (obstructive sleep apnea)    Current CPAP is not working  . Panic attacks   . RLS (restless legs syndrome)     Past Surgical History:  Procedure  Laterality Date  . ABDOMINAL HYSTERECTOMY  2005  . BREAST LUMPECTOMY     Bilateral   . KNEE SURGERY    . LAPAROSCOPY  2005  . MASTECTOMY     Right  . OOPHORECTOMY    . TONSILLECTOMY       Current Outpatient Medications  Medication Sig Dispense Refill  . albuterol (ACCUNEB) 0.63 MG/3ML nebulizer solution Inhale into the lungs.    Marland Kitchen albuterol (VENTOLIN HFA) 108 (90 Base) MCG/ACT inhaler     . amLODipine (NORVASC) 2.5 MG tablet TAKE 1 TABLET BY MOUTH DAILY    . aspirin 81 MG EC tablet Take by mouth.    Marland Kitchen atorvastatin (LIPITOR) 10 MG tablet 10 mg.     . Calcium Carb-Cholecalciferol (CALCIUM PLUS VITAMIN D3 PO) Take 1 tablet by mouth daily.    . Cholecalciferol (VITAMIN D3) 5000 UNITS TABS Take 1 tablet by mouth daily.    . cyclobenzaprine (FLEXERIL) 10 MG tablet cyclobenzaprine 10 mg tablet    . famotidine (PEPCID) 20 MG tablet One at bedtime 30 tablet 2  . gabapentin (NEURONTIN) 600 MG tablet Take 600 mg by mouth 2 (two) times daily.     . metoprolol tartrate (LOPRESSOR) 25 MG tablet Take  12.5 mg by mouth 2 (two) times daily.     . montelukast (SINGULAIR) 10 MG tablet montelukast 10 mg tablet    . omeprazole (PRILOSEC) 40 MG capsule Take 40 mg by mouth daily.    . potassium chloride SA (KLOR-CON) 20 MEQ tablet potassium chloride ER 20 mEq tablet,extended release(part/cryst)    . promethazine (PHENERGAN) 25 MG tablet Take 25 mg by mouth every 6 (six) hours as needed for nausea or vomiting.    . SUMAtriptan (IMITREX) 100 MG tablet Take by mouth.    . traMADol (ULTRAM) 50 MG tablet Take 50 mg by mouth every 6 (six) hours as needed.       No current facility-administered medications for this visit.    Allergies:   Avelox [moxifloxacin hcl in nacl], Codeine, Corn oil, Diazepam, Fish oil, Hydrocodone, Hydrocodone-acetaminophen, Levofloxacin, Levofloxacin, Lortab [hydrocodone-acetaminophen], Moxifloxacin, Norethindrone, Oxycodone hcl, Penicillins, Prednisone, Ropinirole hydrochloride,  Sulfonamide derivatives, and Topiramate    Social History:  The patient  reports that she has never smoked. She has never used smokeless tobacco. She reports that she does not drink alcohol or use drugs.   Family History:  The patient's family history includes Breast cancer in her mother; Coronary artery disease in her brother; Diabetes in her father and mother; Heart disease in her maternal grandmother; Hyperlipidemia in her father and mother; Hypertension in her brother and father; Pancreatic cancer in her mother; Stroke in her maternal grandmother.    ROS:  Please see the history of present illness.   Otherwise, review of systems are positive for none.   All other systems are reviewed and negative.    PHYSICAL EXAM: VS:  BP 118/70   Pulse 60   Temp (!) 97.1 F (36.2 C)   Ht 5\' 1"  (1.549 m)   Wt 192 lb (87.1 kg)   SpO2 98%   BMI 36.28 kg/m  , BMI Body mass index is 36.28 kg/m. GENERAL:  Well appearing NECK:  No jugular venous distention, waveform within normal limits, carotid upstroke brisk and symmetric, no bruits, no thyromegaly LUNGS:  Clear to auscultation bilaterally CHEST:  Unremarkable HEART:  PMI not displaced or sustained,S1 and S2 within normal limits, no S3, no S4, no clicks, no rubs, no murmurs ABD:  Flat, positive bowel sounds normal in frequency in pitch, no bruits, no rebound, no guarding, no midline pulsatile mass, no hepatomegaly, no splenomegaly EXT:  2 plus pulses throughout, no edema, no cyanosis no clubbing   EKG:  EKG is ordered today. The ekg ordered today demonstrates sinus rhythm, rate 60, rightward axis, borderline QTC prolongation, nonspecific inferior T wave changes.   Recent Labs: No results found for requested labs within last 8760 hours.    Lipid Panel No results found for: CHOL, TRIG, HDL, CHOLHDL, VLDL, LDLCALC, LDLDIRECT    Wt Readings from Last 3 Encounters:  10/07/19 192 lb (87.1 kg)  10/13/17 194 lb 8 oz (88.2 kg)  04/03/15 200 lb  3.2 oz (90.8 kg)      Other studies Reviewed: Additional studies/ records that were reviewed today include: Office records. Review of the above records demonstrates:  Please see elsewhere in the note.     ASSESSMENT AND PLAN:  HTN: I do not suspect a secondary etiology other than maybe her sleep apnea.  It is easily controlled now on these 2 medications and I think this was an excellent choice.  I will get her labs to review to make sure there is no suggestion of any renal  insufficiency or secondary cause.  However, I think weight loss, treatment of her sleep apnea, continue medications, dietary changes is all we need at this time without further imaging.  This is likely essential hypertension.  Of note we did give her a new blood pressure cuff today.  FATIGUE: I am going to check the blood work when made available to me.  However, I think the first step is for her to get her sleep study in CPAP to be evaluated and she apparently has plans to do this.  COVID EDUCATION: She does not get the vaccine.  We talked about this today.   Current medicines are reviewed at length with the patient today.  The patient does not have concerns regarding medicines.  The following changes have been made:  no change  Labs/ tests ordered today include: None  Orders Placed This Encounter  Procedures  . EKG 12-Lead     Disposition:   FU with me as needed.      Signed, Minus Breeding, MD  10/07/2019 11:03 AM    [place]

## 2019-10-07 ENCOUNTER — Encounter: Payer: Self-pay | Admitting: Cardiology

## 2019-10-07 ENCOUNTER — Ambulatory Visit (INDEPENDENT_AMBULATORY_CARE_PROVIDER_SITE_OTHER): Payer: Medicare HMO | Admitting: Cardiology

## 2019-10-07 ENCOUNTER — Other Ambulatory Visit: Payer: Self-pay

## 2019-10-07 VITALS — BP 118/70 | HR 60 | Temp 97.1°F | Ht 61.0 in | Wt 192.0 lb

## 2019-10-07 DIAGNOSIS — R072 Precordial pain: Secondary | ICD-10-CM

## 2019-10-07 NOTE — Patient Instructions (Signed)
Medication Instructions:  NO CHANGES *If you need a refill on your cardiac medications before your next appointment, please call your pharmacy*  Lab Work: NONE ORDERED THIS VISIT  Testing/Procedures: NONE ORDERED THIS VISIT  Follow-Up: At [facility], you and your health needs are our priority.  As part of our continuing mission to provide you with exceptional heart care, we have created designated Provider Care Teams.  These Care Teams include your primary Cardiologist (physician) and Advanced Practice Providers (APPs -  Physician Assistants and Nurse Practitioners) who all work together to provide you with the care you need, when you need it.  Your next appointment:   FOLLOW UP AS NEEDED  Other Instructions BLOOD PRESSURE CUFF GIVEN AT TODAY'S VISIT

## 2020-05-14 ENCOUNTER — Telehealth: Payer: Self-pay | Admitting: Oncology

## 2020-05-14 NOTE — Telephone Encounter (Signed)
05/14/20 lft msg

## 2020-05-18 NOTE — Telephone Encounter (Signed)
Next appt given to patient 

## 2020-05-21 ENCOUNTER — Telehealth: Payer: Self-pay | Admitting: Oncology

## 2020-05-21 NOTE — Telephone Encounter (Signed)
05/21/20 Spoke with patient and scheduled lab/dv

## 2020-06-13 NOTE — Progress Notes (Signed)
[place]  [address] [place],  [place]  [address] [phone]  Clinic Day:  06/15/2020  Referring physician: Imagene Riches, NP   This document serves as a record of services personally performed by Hosie Poisson, MD. It was created on their behalf by Curry,Lauren E, a trained medical scribe. The creation of this record is based on the scribe's personal observations and the provider's statements to them.   CHIEF COMPLAINT:  CC: History of stage IIIA triple negative breast cancer  Current Treatment:  Surveillance   HISTORY OF PRESENT ILLNESS:  Danielle Contreras is a 50 y.o. female with a history of stage IIIA triple negative breast cancer diagnosed at age 37 in July 2003. She was treated with a right modified radical mastectomy, followed by adjuvant chemotherapy with Adriamycin and cyclophosphamide for 3 months and Taxol for 3 months.  She has had a total abdominal hysterectomy and bilateral salpingo-oophorectomy for endometriosis.  At her visit in 2016, there was concern over an area of thickening in the left breast, as well as increased pain.  She underwent a diagnostic left breast mammogram with ultrasound, as well as CT chest and bone scan.  There was no evidence of malignancy on mammogram and ultrasound the breast or CT chest.  There were degenerative changes on the bone scan, but no evidence of malignancy.  Her mother had bilateral breast cancer at 86 and then at 14, a maternal aunt had breast cancer at uncertain age, a maternal niece had cervical cancer at an uncertain age, and a maternal uncle died of an uncertain malignancy at an uncertain age.  She did have genetic testing and this was negative.  She had a CT of the brain in February 2019 which was negative, and a thyroid ultrasound which was negative for thyroid nodules.  In March she had an ultrasound of the carotid arteries which revealed mild arteriosclerosis and less than 50%  stenosis.  In June, she had an MRI of the cervical spine because of severe pain and numbness of the left arm.  This showed mild degenerative changes but no stenosis, although she did have small left-sided disc protrusion at C6-7.  She also had a CT scan of abdomen and pelvis in June which was equivocal with some filling defects in the ascending colon and benign lipomatous infiltration of the ileocecal valve.  She has a small ventral hernia. There was a tiny cyst in the liver which appears benign. Her mother was diagnosed with pancreatic cancer and expired in 2020.  She had colonoscopy in 2020 with Dr. Melina Copa, and has chronic soft stool or diarrhea.  INTERVAL HISTORY:  Danielle Contreras is here for annual follow up and states that she has been well.  Annual unilateral left mammogram from February 1st was was clear.  She had a lichenoid keratosis removed from the right mastectomy site by Dr. Noberto Retort last year.  She has had episodes of rectal bleeding and states that she was scheduled for colonoscopy with Dr. Melina Copa, but was told she would have to pay up front, which she has never had to do.  Therefore, this evaluation has not been done and will be rescheduled.  Hemoglobin has mildly decreased from 12.5 to 11.7, and her white count and platelets are normal.  Chemistries are unremarkable.  Her  appetite is good, and she has gained 20 pounds since her last visit.  She denies fever, chills or other signs of infection.  She denies nausea, vomiting,  bowel issues, or abdominal pain.  She denies sore throat, cough, dyspnea, or chest pain.  REVIEW OF SYSTEMS:  Review of Systems  Constitutional: Negative.   HENT:  Negative.   Eyes: Negative.   Respiratory: Negative.   Cardiovascular: Negative.   Gastrointestinal:       Rectal bleeding  Endocrine: Negative.   Genitourinary: Negative.    Musculoskeletal: Negative.   Skin: Negative.   Neurological: Negative.   Hematological: Negative.   Psychiatric/Behavioral:  Negative.      VITALS:  Blood pressure 134/90, pulse 79, temperature 98.2 F (36.8 C), temperature source Oral, resp. rate 18, height 5\' 1"  (1.549 m), weight 212 lb (96.2 kg), SpO2 98 %.  Wt Readings from Last 3 Encounters:  06/15/20 212 lb (96.2 kg)  10/07/19 192 lb (87.1 kg)  10/13/17 194 lb 8 oz (88.2 kg)    Body mass index is 40.06 kg/m.  Performance status (ECOG): 1 - Symptomatic but completely ambulatory  PHYSICAL EXAM:  Physical Exam Constitutional:      General: She is not in acute distress.    Appearance: Normal appearance. She is normal weight.  HENT:     Head: Normocephalic and atraumatic.  Eyes:     General: No scleral icterus.    Extraocular Movements: Extraocular movements intact.     Conjunctiva/sclera: Conjunctivae normal.     Pupils: Pupils are equal, round, and reactive to light.  Cardiovascular:     Rate and Rhythm: Normal rate and regular rhythm.     Pulses: Normal pulses.     Heart sounds: Normal heart sounds. No murmur heard. No friction rub. No gallop.   Pulmonary:     Effort: Pulmonary effort is normal. No respiratory distress.     Breath sounds: Normal breath sounds.  Chest:     Comments: Mild pink keratotic scar in the mid right mastectomy which is healing fine.  Left breast has scattered fibrocystic changes, but nothing suspicious. Abdominal:     General: Bowel sounds are normal. There is no distension.     Palpations: Abdomen is soft. There is no mass.     Tenderness: There is no abdominal tenderness.  Musculoskeletal:        General: Normal range of motion.     Cervical back: Normal range of motion and neck supple.     Right lower leg: No edema.     Left lower leg: No edema.  Lymphadenopathy:     Cervical: No cervical adenopathy.  Skin:    General: Skin is warm and dry.  Neurological:     General: No focal deficit present.     Mental Status: She is alert and oriented to person, place, and time. Mental status is at baseline.   Psychiatric:        Mood and Affect: Mood normal.        Behavior: Behavior normal.        Thought Content: Thought content normal.        Judgment: Judgment normal.     LABS:   CBC Latest Ref Rng & Units 03/15/2008 03/15/2008  WBC 4.5 - 10.5 10*3/microliter 7.1 -  Hemoglobin 12.0 - 15.0 g/dL 12.5 12.3  Hematocrit 36.0 - 46.0 % 36.9 -  Platelets 150 - 400 K/uL 336 -   CMP Latest Ref Rng & Units 03/15/2008  Glucose 70 - 99 mg/dL 104(H)  BUN 6 - 23 mg/dL 15  Creatinine 0.4 - 1.2 mg/dL 0.8  Sodium 135 - 145 meq/L 147(H)  Potassium 3.5 - 5.1 meq/L 4.9  Chloride 96 - 112 meq/L 105  CO2 19 - 32 meq/L 32  Calcium 8.4 - 10.5 mg/dL 9.6  Total Protein 6.0 - 8.3 g/dL 7.4  Total Bilirubin 0.3 - 1.2 mg/dL 0.6  Alkaline Phos 39 - 117 units/L 100  AST 0 - 37 units/L 25  ALT 0 - 35 units/L 14     No results found for: CEA1 / No results found for: CEA1 No results found for: PSA1 No results found for: [id number] No results found for: CAN125  No results found for: TOTALPROTELP, ALBUMINELP, A1GS, A2GS, BETS, BETA2SER, GAMS, MSPIKE, SPEI Lab Results  Component Value Date   IRONPCTSAT 11.1 (L) 03/15/2008   No results found for: LDH   STUDIES:   Annual mammogram from 06/24/2020 revealed: breast density category B.  No mammographic evidence of malignancy.  Allergies:  Allergies  Allergen Reactions  . Lac Bovis Diarrhea    Other reaction(s): Abdominal Pain, Abdominal Pain  . Avelox [Moxifloxacin Hcl In Nacl]   . Codeine   . Diazepam   . Hydrocodone   . Hydrocodone-Acetaminophen   . Hydrocodone-Acetaminophen   . Levofloxacin   . Levofloxacin   . Moxifloxacin   . Norethindrone   . Oxycodone Hcl   . Penicillins   . Prednisone Swelling  . Ropinirole Hydrochloride   . Sulfonamide Derivatives   . Topiramate   . Corn Oil Other (See Comments)    unknown Other reaction(s): Abdominal Pain  . Fish Oil Other (See Comments)    INTESTINAL Other reaction(s): Abdominal Pain    Current  Medications: Current Outpatient Medications  Medication Sig Dispense Refill  . albuterol (VENTOLIN HFA) 108 (90 Base) MCG/ACT inhaler     . amLODipine (NORVASC) 2.5 MG tablet TAKE 1 TABLET BY MOUTH DAILY    . aspirin 81 MG EC tablet Take by mouth.    Marland Kitchen atorvastatin (LIPITOR) 20 MG tablet atorvastatin 20 mg tablet    . Calcium 500 MG tablet Calcium 500    . Calcium Carb-Cholecalciferol (CALCIUM PLUS VITAMIN D3 PO) Take 1 tablet by mouth daily.    . cyclobenzaprine (FLEXERIL) 10 MG tablet cyclobenzaprine 10 mg tablet    . dicyclomine (BENTYL) 20 MG tablet dicyclomine 20 mg tablet    . gabapentin (NEURONTIN) 600 MG tablet Take 600 mg by mouth 2 (two) times daily.     . meclizine (ANTIVERT) 25 MG tablet meclizine 25 mg tablet  TAKE 1 TAB(S) ORALLY EVERY 6 HOURS    . metoprolol tartrate (LOPRESSOR) 25 MG tablet Take 12.5 mg by mouth 2 (two) times daily.     . montelukast (SINGULAIR) 10 MG tablet montelukast 10 mg tablet    . omeprazole (PRILOSEC) 40 MG capsule Take 40 mg by mouth daily.    . ondansetron (ZOFRAN-ODT) 4 MG disintegrating tablet ondansetron 4 mg disintegrating tablet  TAKE 1 TABLET BY MOUTH EVERY 6 HOURS AS NEEDED FOR NAUSEA/VOMITING    . potassium chloride SA (KLOR-CON) 20 MEQ tablet potassium chloride ER 20 mEq tablet,extended release(part/cryst)    . promethazine (PHENERGAN) 25 MG tablet Take 25 mg by mouth every 6 (six) hours as needed for nausea or vomiting.    . traMADol (ULTRAM) 50 MG tablet Take 50 mg by mouth every 6 (six) hours as needed.       No current facility-administered medications for this visit.     ASSESSMENT & PLAN:   Assessment:   1.  Remote history of breast cancer, diagnosed in  July 2003.  She remains without evidence of disease.  2.  Hypokalemia, resolved on oral supplement 20 meq daily.  3.  Lichenoid keratosis of the right mastectomy incision, excised by Dr. Noberto Retort.  4.  Rectal bleeding.  I encouraged her to follow up with the colonoscopy when  she is able to do so.  Plan: I can see her back in 1 year with CBC and comprehensive metabolic profile and left mammogram.  She understands and agrees with this plan of care.   I provided 20 minutes of face-to-face time during this this encounter and > 50% was spent counseling as documented under my assessment and plan.    Derwood Kaplan, MD [facility] AT [facility] [address] [place] [address] Dept: [phone] Dept Fax: [phone]   I, Rita Ohara, am acting as scribe for Derwood Kaplan, MD  I have reviewed this report as typed by the medical scribe, and it is complete and accurate.

## 2020-06-15 ENCOUNTER — Inpatient Hospital Stay: Payer: Medicare HMO | Attending: Oncology | Admitting: Oncology

## 2020-06-15 ENCOUNTER — Other Ambulatory Visit: Payer: Self-pay | Admitting: Oncology

## 2020-06-15 ENCOUNTER — Inpatient Hospital Stay: Payer: Medicare HMO

## 2020-06-15 ENCOUNTER — Other Ambulatory Visit: Payer: Self-pay

## 2020-06-15 ENCOUNTER — Other Ambulatory Visit: Payer: Self-pay | Admitting: Hematology and Oncology

## 2020-06-15 ENCOUNTER — Encounter: Payer: Self-pay | Admitting: Oncology

## 2020-06-15 DIAGNOSIS — C50911 Malignant neoplasm of unspecified site of right female breast: Secondary | ICD-10-CM

## 2020-06-15 LAB — BASIC METABOLIC PANEL
BUN: 12 (ref 4–21)
CO2: 34 — AB (ref 13–22)
Chloride: 101 (ref 99–108)
Creatinine: 0.7 (ref 0.5–1.1)
Glucose: 100
Potassium: 3.5 (ref 3.4–5.3)
Sodium: 139 (ref 137–147)

## 2020-06-15 LAB — HEPATIC FUNCTION PANEL
ALT: 17 (ref 7–35)
AST: 34 (ref 13–35)
Alkaline Phosphatase: 116 (ref 25–125)
Bilirubin, Total: 0.4

## 2020-06-15 LAB — CBC AND DIFFERENTIAL
HCT: 34 — AB (ref 36–46)
Hemoglobin: 11.7 — AB (ref 12.0–16.0)
Neutrophils Absolute: 2.01
Platelets: 320 (ref 150–399)
WBC: 6.1

## 2020-06-15 LAB — COMPREHENSIVE METABOLIC PANEL
Albumin: 4.1 (ref 3.5–5.0)
Calcium: 9.2 (ref 8.7–10.7)

## 2020-06-15 LAB — CBC: RBC: 3.81 — AB (ref 3.87–5.11)

## 2020-06-22 ENCOUNTER — Other Ambulatory Visit: Payer: Self-pay | Admitting: Oncology

## 2020-06-22 DIAGNOSIS — Z171 Estrogen receptor negative status [ER-]: Secondary | ICD-10-CM

## 2020-06-22 DIAGNOSIS — C50111 Malignant neoplasm of central portion of right female breast: Secondary | ICD-10-CM

## 2020-11-30 ENCOUNTER — Telehealth: Payer: Self-pay | Admitting: Cardiology

## 2020-11-30 NOTE — Telephone Encounter (Signed)
Spoke with pt, she reports being sick for 1 month with sinus infection and possible covid. She stopped taking the amlodipine and decreased the metoprolol to once daily because her intake was low and her bp was low. Now she has noticed her heart rate is elevating when she is active. When her heart rate was elevated she has SOB. She will also get elevated bp  and get a pinch under her left beast that will last until she gets her bp down. She is feeling better and is trying to increase her intake. Encouraged patient to restart the metoprolol at 12.5 mg twice daily everyday to help get her heart rate and bp evened out. She will continue to increase her intake and monitor her bp. Follow up scheduled

## 2020-11-30 NOTE — Telephone Encounter (Signed)
     Pt c/o BP issue: STAT if pt c/o blurred vision, one-sided weakness or slurred speech  1. What are your last 5 BP readings? Fluctuating BP   2. Are you having any other symptoms (ex. Dizziness, headache, blurred vision, passed out)? CP and SOB  3. What is your BP issue? Pt said her BP been fluctuating to being normal to too low and too high, she said she's been experiencing sob if her BP and HR is high, she also feels pinching sensation underneath her left breast. She wanted to see Dr. Percival Spanish for follow up, gave sooner appt in October and she said she needs to be seen sooner than that

## 2020-12-20 DIAGNOSIS — R5383 Other fatigue: Secondary | ICD-10-CM | POA: Insufficient documentation

## 2020-12-20 NOTE — Progress Notes (Deleted)
Cardiology Office Note   Date:  12/20/2020   ID:  [id number] [place], [place] 1970-11-22, MRN [id number]  PCP:  Imagene Riches, NP  Cardiologist:   None Referring:  Imagene Riches, NP  No chief complaint on file.     History of Present Illness: Danielle Contreras is a 50 y.o. female  who presents for evaluation of HTN. I saw her in 2012 and 2016 for chest pain. She had a negative stress test in 2012.  She had difficult to control HTN.   ***     ***   Who presents for difficult to control hypertension.  She recently had blood pressures in the 170s over 100s.  I reviewed some outpatient office records that confirmed this.  She was started on metoprolol.  She was also started on amlodipine.  Her blood pressure is now well controlled according to the readings today.  Is not entirely clear that they have been completely well controlled at home as her cuff is a wrist cuff and may be not accurate.  She is not having any acute cardiovascular symptoms such as chest pressure, neck or arm breath, PND or orthopnea.  Has had no new palpitations, presyncope or syncope.  Her biggest issue is sleep apnea and her CPAP does not work.  She is going to have a new sleep study.  She has severe daytime somnolence.  Some of this is exacerbated by her meds.  She has problems with her sleep-wake cycle.  This really limits her doing anything and getting out.    Past Medical History:  Diagnosis Date   Anxiety    Asthma    Bipolar 1 disorder ([place])    Breast cancer ([place])    Depression    Fibrocystic breast    Fibromyalgia    Gastritis    GERD (gastroesophageal reflux disease)    Headache(784.0)    Heel spur    RIGHT   Hyperlipidemia    IBS (irritable bowel syndrome)    Lymphedema    right arm   Migraines    OSA (obstructive sleep apnea)    Current CPAP is not working   Panic attacks    RLS (restless legs syndrome)     Past Surgical History:  Procedure Laterality Date    ABDOMINAL HYSTERECTOMY  2005   BREAST LUMPECTOMY     Bilateral    KNEE SURGERY     LAPAROSCOPY  2005   MASTECTOMY     Right   OOPHORECTOMY     TONSILLECTOMY       Current Outpatient Medications  Medication Sig Dispense Refill   albuterol (VENTOLIN HFA) 108 (90 Base) MCG/ACT inhaler      amLODipine (NORVASC) 2.5 MG tablet TAKE 1 TABLET BY MOUTH DAILY     aspirin 81 MG EC tablet Take by mouth.     atorvastatin (LIPITOR) 20 MG tablet atorvastatin 20 mg tablet     Calcium 500 MG tablet Calcium 500     Calcium Carb-Cholecalciferol (CALCIUM PLUS VITAMIN D3 PO) Take 1 tablet by mouth daily.     cyclobenzaprine (FLEXERIL) 10 MG tablet cyclobenzaprine 10 mg tablet     dicyclomine (BENTYL) 20 MG tablet dicyclomine 20 mg tablet     gabapentin (NEURONTIN) 600 MG tablet Take 600 mg by mouth 2 (two) times daily.      meclizine (ANTIVERT) 25 MG tablet meclizine 25 mg tablet  TAKE 1 TAB(S) ORALLY EVERY 6 HOURS  metoprolol tartrate (LOPRESSOR) 25 MG tablet Take 12.5 mg by mouth 2 (two) times daily.      montelukast (SINGULAIR) 10 MG tablet montelukast 10 mg tablet     omeprazole (PRILOSEC) 40 MG capsule Take 40 mg by mouth daily.     ondansetron (ZOFRAN-ODT) 4 MG disintegrating tablet ondansetron 4 mg disintegrating tablet  TAKE 1 TABLET BY MOUTH EVERY 6 HOURS AS NEEDED FOR NAUSEA/VOMITING     potassium chloride SA (KLOR-CON) 20 MEQ tablet potassium chloride ER 20 mEq tablet,extended release(part/cryst)     promethazine (PHENERGAN) 25 MG tablet Take 25 mg by mouth every 6 (six) hours as needed for nausea or vomiting.     traMADol (ULTRAM) 50 MG tablet Take 50 mg by mouth every 6 (six) hours as needed.       No current facility-administered medications for this visit.    Allergies:   Lac bovis, Avelox [moxifloxacin hcl in nacl], Codeine, Diazepam, Hydrocodone, Hydrocodone-acetaminophen, Hydrocodone-acetaminophen, Levofloxacin, Levofloxacin, Moxifloxacin, Norethindrone, Oxycodone hcl,  Penicillins, Prednisone, Ropinirole hydrochloride, Sulfonamide derivatives, Topiramate, Corn oil, and Fish oil   ROS:  Please see the history of present illness.   Otherwise, review of systems are positive for ***.   All other systems are reviewed and negative.    PHYSICAL EXAM: VS:  There were no vitals taken for this visit. , BMI There is no height or weight on file to calculate BMI. GENERAL:  Well appearing NECK:  No jugular venous distention, waveform within normal limits, carotid upstroke brisk and symmetric, no bruits, no thyromegaly LUNGS:  Clear to auscultation bilaterally CHEST:  Unremarkable HEART:  PMI not displaced or sustained,S1 and S2 within normal limits, no S3, no S4, no clicks, no rubs, *** murmurs ABD:  Flat, positive bowel sounds normal in frequency in pitch, no bruits, no rebound, no guarding, no midline pulsatile mass, no hepatomegaly, no splenomegaly EXT:  2 plus pulses throughout, no edema, no cyanosis no clubbing     *** GENERAL:  Well appearing NECK:  No jugular venous distention, waveform within normal limits, carotid upstroke brisk and symmetric, no bruits, no thyromegaly LUNGS:  Clear to auscultation bilaterally CHEST:  Unremarkable HEART:  PMI not displaced or sustained,S1 and S2 within normal limits, no S3, no S4, no clicks, no rubs, no murmurs ABD:  Flat, positive bowel sounds normal in frequency in pitch, no bruits, no rebound, no guarding, no midline pulsatile mass, no hepatomegaly, no splenomegaly EXT:  2 plus pulses throughout, no edema, no cyanosis no clubbing   EKG:  EKG is *** ordered today. The ekg ordered today demonstrates sinus rhythm, rate ***, rightward axis, borderline QTC prolongation, nonspecific inferior T wave changes.   Recent Labs: 06/15/2020: ALT 17; BUN 12; Creatinine 0.7; Hemoglobin 11.7; Platelets 320; Potassium 3.5; Sodium 139    Lipid Panel No results found for: CHOL, TRIG, HDL, CHOLHDL, VLDL, LDLCALC, LDLDIRECT    Wt  Readings from Last 3 Encounters:  06/15/20 212 lb (96.2 kg)  10/07/19 192 lb (87.1 kg)  10/13/17 194 lb 8 oz (88.2 kg)      Other studies Reviewed: Additional studies/ records that were reviewed today include: *** Review of the above records demonstrates:  Please see elsewhere in the note.     ASSESSMENT AND PLAN:  HTN:     ***    I do not suspect a secondary etiology other than maybe her sleep apnea.  It is easily controlled now on these 2 medications and I think this was an excellent choice.  I will get her labs to review to make sure there is no suggestion of any renal insufficiency or secondary cause.  However, I think weight loss, treatment of her sleep apnea, continue medications, dietary changes is all we need at this time without further imaging.  This is likely essential hypertension.  Of note we did give her a new blood pressure cuff today.  FATIGUE:   ***   I am going to check the blood work when made available to me.  However, I think the first step is for her to get her sleep study in CPAP to be evaluated and she apparently has plans to do this.   Current medicines are reviewed at length with the patient today.  The patient does not have concerns regarding medicines.  The following changes have been made:  ***  Labs/ tests ordered today include:     ***     No orders of the defined types were placed in this encounter.    Disposition:   FU with me ***    Signed, Minus Breeding, MD  12/20/2020 4:59 PM    [facility]

## 2020-12-21 ENCOUNTER — Ambulatory Visit: Payer: Medicare HMO | Admitting: Cardiology

## 2020-12-21 DIAGNOSIS — R5383 Other fatigue: Secondary | ICD-10-CM

## 2020-12-21 DIAGNOSIS — I10 Essential (primary) hypertension: Secondary | ICD-10-CM

## 2021-01-06 DIAGNOSIS — I160 Hypertensive urgency: Secondary | ICD-10-CM | POA: Insufficient documentation

## 2021-01-06 NOTE — Progress Notes (Signed)
Cardiology Office Note   Date:  01/07/2021   ID:  [address] [place], [place] [birth year], MRN [id number]  PCP:  Imagene Riches, NP  Cardiologist:   None Referring:  Imagene Riches, NP  Chief Complaint  Patient presents with   Shortness of Breath       History of Present Illness: Danielle Contreras is a 50 y.o. female  who presents for evaluation of HTN. I saw her in 2012 and 2016 for chest pain. She had a negative stress test in 2012.  She had difficult to control HTN.   I last saw her in 2021.  She cannot sleep.  She did not get tested for COVID but she had lots of symptoms including losing her smell and taste.  She actually stopped taking her amlodipine at that point.  She also has had some low heart rate so her metoprolol was reduced to 12 and half twice a day.  She has been very sensitive to medications.  She brings a blood pressure diary and says she does not feel well when it goes high or goes low.  I am seeing the 0000000 to [id number] systolic with normal diastolics.  She says she feels bad when her heart rate goes low but her heart rates have been in the 60s and 70s for the most part.  It must of been lower when she was on the higher dose of beta-blocker.  She is not describing frank syncope.  She has shortness of breath when her blood pressure is too high.  She gets some pain under her left breast when her pressure is too high.  She is very sedentary doing some minimal housework and sounds.  She is not describing resting substernal chest pressure, neck or arm discomfort.  He is not describing PND or orthopnea.  Past Medical History:  Diagnosis Date   Anxiety    Asthma    Bipolar 1 disorder ([place])    Breast cancer ([place])    Depression    Fibrocystic breast    Fibromyalgia    Gastritis    GERD (gastroesophageal reflux disease)    Headache(784.0)    Heel spur    RIGHT   Hyperlipidemia    IBS (irritable bowel syndrome)    Lymphedema    right arm   Migraines    OSA  (obstructive sleep apnea)    Current CPAP is not working   Panic attacks    RLS (restless legs syndrome)     Past Surgical History:  Procedure Laterality Date   ABDOMINAL HYSTERECTOMY  2005   BREAST LUMPECTOMY     Bilateral    KNEE SURGERY     LAPAROSCOPY  2005   MASTECTOMY     Right   OOPHORECTOMY     TONSILLECTOMY       Current Outpatient Medications  Medication Sig Dispense Refill   albuterol (VENTOLIN HFA) 108 (90 Base) MCG/ACT inhaler      aspirin 81 MG EC tablet Take by mouth.     atorvastatin (LIPITOR) 20 MG tablet atorvastatin 20 mg tablet     Calcium 500 MG tablet Calcium 500     Calcium Carb-Cholecalciferol (CALCIUM PLUS VITAMIN D3 PO) Take 1 tablet by mouth daily.     cyclobenzaprine (FLEXERIL) 10 MG tablet cyclobenzaprine 10 mg tablet     dicyclomine (BENTYL) 20 MG tablet dicyclomine 20 mg tablet     gabapentin (NEURONTIN) 600 MG tablet Take 600 mg by mouth  3 (three) times daily.     meclizine (ANTIVERT) 25 MG tablet meclizine 25 mg tablet  TAKE 1 TAB(S) ORALLY EVERY 6 HOURS     metoprolol tartrate (LOPRESSOR) 25 MG tablet Take 12.5 mg by mouth 2 (two) times daily.      montelukast (SINGULAIR) 10 MG tablet montelukast 10 mg tablet     omeprazole (PRILOSEC) 40 MG capsule Take 40 mg by mouth daily.     ondansetron (ZOFRAN-ODT) 4 MG disintegrating tablet ondansetron 4 mg disintegrating tablet  TAKE 1 TABLET BY MOUTH EVERY 6 HOURS AS NEEDED FOR NAUSEA/VOMITING     potassium chloride SA (KLOR-CON) 20 MEQ tablet potassium chloride ER 20 mEq tablet,extended release(part/cryst)     promethazine (PHENERGAN) 25 MG tablet Take 25 mg by mouth every 6 (six) hours as needed for nausea or vomiting.     traMADol (ULTRAM) 50 MG tablet Take 50 mg by mouth every 6 (six) hours as needed.       amLODipine (NORVASC) 2.5 MG tablet Take 1 tablet (2.5 mg total) by mouth at bedtime. 90 tablet 3   No current facility-administered medications for this visit.    Allergies:   Lac bovis,  Avelox [moxifloxacin hcl in nacl], Codeine, Diazepam, Hydrocodone, Hydrocodone-acetaminophen, Hydrocodone-acetaminophen, Levofloxacin, Levofloxacin, Moxifloxacin, Norethindrone, Oxycodone hcl, Penicillins, Prednisone, Ropinirole hydrochloride, Sulfonamide derivatives, Topiramate, Corn oil, and Fish oil   ROS:  Please see the history of present illness.   Otherwise, review of systems are positive for none.   All other systems are reviewed and negative.    PHYSICAL EXAM: VS:  BP 134/60 (BP Location: Left Wrist, Patient Position: Sitting, Cuff Size: Normal)   Pulse 83   Ht '5\' 1"'$  (1.549 m)   Wt 205 lb (93 kg)   BMI 38.73 kg/m  , BMI Body mass index is 38.73 kg/m. GENERAL:  Well appearing NECK:  No jugular venous distention, waveform within normal limits, carotid upstroke brisk and symmetric, no bruits, no thyromegaly LUNGS:  Clear to auscultation bilaterally CHEST:  Unremarkable HEART:  PMI not displaced or sustained,S1 and S2 within normal limits, no S3, no S4, no clicks, no rubs, no murmurs ABD:  Flat, positive bowel sounds normal in frequency in pitch, no bruits, no rebound, no guarding, no midline pulsatile mass, no hepatomegaly, no splenomegaly EXT:  2 plus pulses throughout, no edema, no cyanosis no clubbing   EKG:  EKG is  ordered today. The ekg ordered today demonstrates sinus rhythm, rate 83, rightward axis, nonspecific inferior T wave changes.   Recent Labs: 06/15/2020: ALT 17; BUN 12; Creatinine 0.7; Hemoglobin 11.7; Platelets 320; Potassium 3.5; Sodium 139    Lipid Panel No results found for: CHOL, TRIG, HDL, CHOLHDL, VLDL, LDLCALC, LDLDIRECT    Wt Readings from Last 3 Encounters:  01/07/21 205 lb (93 kg)  06/15/20 212 lb (96.2 kg)  10/07/19 192 lb (87.1 kg)      Other studies Reviewed: Additional studies/ records that were reviewed today include: Labs Review of the above records demonstrates:  Please see elsewhere in the note.     ASSESSMENT AND PLAN:  HTN:      She has been sensitive to medications.  I have asked her to restart her amlodipine 2.5 mg at night.  She can remain on the lower dose of beta-blocker.  She will keep a blood pressure diary.  FATIGUE:   She is finally rescheduling a sleep study which had to be canceled during [place] which I have encouraged her to keep.  She  has a diagnosis but has not been able to use CPAP.  I do not do recent labs include a mildly reduced hemoglobin but this is stable.  Her fatigue really needs to be addressed once she is on adequate CPAP.  Current medicines are reviewed at length with the patient today.  The patient does not have concerns regarding medicines.  The following changes have been made: As above  Labs/ tests ordered today include:     None     Orders Placed This Encounter  Procedures   EKG 12-Lead      Disposition:   FU with APP in 6 months   Signed, Danielle Breeding, MD  01/07/2021 9:01 AM    [place]

## 2021-01-07 ENCOUNTER — Encounter: Payer: Self-pay | Admitting: Cardiology

## 2021-01-07 ENCOUNTER — Other Ambulatory Visit: Payer: Self-pay

## 2021-01-07 ENCOUNTER — Ambulatory Visit: Payer: Medicare HMO | Admitting: Cardiology

## 2021-01-07 VITALS — BP 134/60 | HR 83 | Ht 61.0 in | Wt 205.0 lb

## 2021-01-07 DIAGNOSIS — I160 Hypertensive urgency: Secondary | ICD-10-CM

## 2021-01-07 MED ORDER — AMLODIPINE BESYLATE 2.5 MG PO TABS: 2.5000 mg | ORAL_TABLET | Freq: Every day | ORAL | 3 refills | Status: DC

## 2021-01-07 NOTE — Patient Instructions (Signed)
Medication Instructions:   Restart taking Amlodipine 2.5 mg one tablet at bedtime  No other changes  *If you need a refill on your cardiac medications before your next appointment, please call your pharmacy*  Other Instructions Please keep a log of blood pressure   Lab Work: Not needed    Testing/Procedures: Not needed   Follow-Up: At [facility], you and your health needs are our priority.  As part of our continuing mission to provide you with exceptional heart care, we have created designated Provider Care Teams.  These Care Teams include your primary Cardiologist (physician) and Advanced Practice Providers (APPs -  Physician Assistants and Nurse Practitioners) who all work together to provide you with the care you need, when you need it.  We recommend signing up for the patient portal called "MyChart".  Sign up information is provided on this After Visit Summary.  MyChart is used to connect with patients for Virtual Visits (Telemedicine).  Patients are able to view lab/test results, encounter notes, upcoming appointments, etc.  Non-urgent messages can be sent to your provider as well.   To learn more about what you can do with MyChart, go to [email or web address].    Your next appointment:   6 month(s)  The format for your next appointment:   In Person  Provider:   Minus Breeding, MD

## 2021-03-12 NOTE — Progress Notes (Signed)
Cardiology Office Note   Date:  03/13/2021   ID:  [address] [place], [place] 1971/01/21, MRN [id number]  PCP:  Imagene Riches, NP  Cardiologist:   None Referring:  Imagene Riches, NP  Chief Complaint  Patient presents with   Dizziness        History of Present Illness: Danielle Contreras Anwitha Mapes is a 50 y.o. female  who presents for evaluation of HTN. I saw her in 2012 and 2016 for chest pain. She had a negative stress test in 2012.  She had difficult to control HTN.   I last saw her in August for follow up of hypertensive urgency.    She was started on Norvasc but she felt bad because her blood pressure went too low.  She stopped taking this.  She said that since she has had a potassium supplement at her blood pressure has been much better.  She also says her magnesium was low and she is currently taking this.  She takes a quarter of 25 mg metoprolol as needed if her blood pressure or heart rate is going high.  Overall she feels better than she did.  She might get a little bit of dizziness when she goes to standing up.  She was very active the other day cleaning out some boxes in a trailer.  She did not have any significant difficulty with this.  She has not been having any new chest pressure, neck or arm discomfort.  She not been having any new shortness of breath, PND or orthopnea.  She has had no weight gain or edema.   Past Medical History:  Diagnosis Date   Anxiety    Asthma    Bipolar 1 disorder ([place])    Breast cancer ([place])    Depression    Fibrocystic breast    Fibromyalgia    Gastritis    GERD (gastroesophageal reflux disease)    Headache(784.0)    Heel spur    RIGHT   Hyperlipidemia    IBS (irritable bowel syndrome)    Lymphedema    right arm   Migraines    OSA (obstructive sleep apnea)    Current CPAP is not working   Panic attacks    RLS (restless legs syndrome)     Past Surgical History:  Procedure Laterality Date   ABDOMINAL HYSTERECTOMY   2005   BREAST LUMPECTOMY     Bilateral    KNEE SURGERY     LAPAROSCOPY  2005   MASTECTOMY     Right   OOPHORECTOMY     TONSILLECTOMY       Current Outpatient Medications  Medication Sig Dispense Refill   albuterol (VENTOLIN HFA) 108 (90 Base) MCG/ACT inhaler      aspirin 81 MG EC tablet Take by mouth.     atorvastatin (LIPITOR) 20 MG tablet atorvastatin 20 mg tablet     Calcium 500 MG tablet Calcium 500     Calcium Carb-Cholecalciferol (CALCIUM PLUS VITAMIN D3 PO) Take 1 tablet by mouth daily.     cyclobenzaprine (FLEXERIL) 10 MG tablet cyclobenzaprine 10 mg tablet     dicyclomine (BENTYL) 20 MG tablet dicyclomine 20 mg tablet     gabapentin (NEURONTIN) 600 MG tablet Take 600 mg by mouth 3 (three) times daily.     meclizine (ANTIVERT) 25 MG tablet meclizine 25 mg tablet  TAKE 1 TAB(S) ORALLY EVERY 6 HOURS     metoprolol tartrate (LOPRESSOR) 25 MG tablet Take 12.5  mg by mouth as needed.     montelukast (SINGULAIR) 10 MG tablet montelukast 10 mg tablet     omeprazole (PRILOSEC) 40 MG capsule Take 40 mg by mouth daily.     ondansetron (ZOFRAN) 4 MG tablet ondansetron HCl 4 mg tablet  TAKE 1 TAB(S) ORALLY EVERY 6 HOURS AS NEEDED FOR NAUSEA AND VOMITING.     ondansetron (ZOFRAN-ODT) 4 MG disintegrating tablet ondansetron 4 mg disintegrating tablet  TAKE 1 TABLET BY MOUTH EVERY 6 HOURS AS NEEDED FOR NAUSEA/VOMITING     potassium chloride SA (KLOR-CON) 20 MEQ tablet potassium chloride ER 20 mEq tablet,extended release(part/cryst)     promethazine (PHENERGAN) 25 MG tablet Take 25 mg by mouth every 6 (six) hours as needed for nausea or vomiting.     traMADol (ULTRAM) 50 MG tablet Take 50 mg by mouth every 6 (six) hours as needed.       No current facility-administered medications for this visit.    Allergies:   Lac bovis, Avelox [moxifloxacin hcl in nacl], Codeine, Diazepam, Hydrocodone, Hydrocodone-acetaminophen, Hydrocodone-acetaminophen, Levofloxacin, Levofloxacin, Moxifloxacin,  Norethindrone, Oxycodone hcl, Penicillins, Prednisone, Ropinirole hydrochloride, Sulfonamide derivatives, Topiramate, Corn oil, and Fish oil   ROS:  Please see the history of present illness.   Otherwise, review of systems are positive for none.   All other systems are reviewed and negative.    PHYSICAL EXAM: VS:  BP 138/80   Pulse 75   Ht 5\' 1"  (1.549 m)   Wt 204 lb 3.2 oz (92.6 kg)   SpO2 97%   BMI 38.58 kg/m  , BMI Body mass index is 38.58 kg/m. GENERAL:  Well appearing NECK:  No jugular venous distention, waveform within normal limits, carotid upstroke brisk and symmetric, no bruits, no thyromegaly LUNGS:  Clear to auscultation bilaterally CHEST:  Unremarkable HEART:  PMI not displaced or sustained,S1 and S2 within normal limits, no S3, no S4, no clicks, no rubs, no murmurs ABD:  Flat, positive bowel sounds normal in frequency in pitch, no bruits, no rebound, no guarding, no midline pulsatile mass, no hepatomegaly, no splenomegaly EXT:  2 plus pulses throughout, no edema, no cyanosis no clubbing   EKG:  EKG is   ordered today. The ekg ordered today demonstrates sinus rhythm, rate 75, axis within normal limits, intervals within normal limits, low voltage in the limb leads, no acute ST-T wave changes.  Recent Labs: 06/15/2020: ALT 17; BUN 12; Creatinine 0.7; Hemoglobin 11.7; Platelets 320; Potassium 3.5; Sodium 139    Lipid Panel No results found for: CHOL, TRIG, HDL, CHOLHDL, VLDL, LDLCALC, LDLDIRECT    Wt Readings from Last 3 Encounters:  03/13/21 204 lb 3.2 oz (92.6 kg)  01/07/21 205 lb (93 kg)  06/15/20 212 lb (96.2 kg)      Other studies Reviewed: Additional studies/ records that were reviewed today include: Blood pressure diary Review of the above records demonstrates:  Please see elsewhere in the note.     ASSESSMENT AND PLAN:  HTN:    Her blood pressure is better controlled.  I did review a diary.  Her systolics are typically in the 627O diastolics in the 35K.   She does not tolerate more medications and she has been very sensitive to medications.  No change in therapy.  FATIGUE:   She is finally going to get the CPAP titration of her positive sleep study.  If she gets this treated and she does not feel better I be happy to reevaluate.  She has had labs per  her primary care doctor apparently that she says are normal though I do not have access to all of these.  I will defer follow-up of a CBC and TSH to make sure these are not causes for her fatigue to [place] F, NP  DIZZINESS: She has some mild orthostatic symptoms.  We talked about avoiding sudden postural changes.  Otherwise no change in therapy.     Current medicines are reviewed at length with the patient today.  The patient does not have concerns regarding medicines.  The following changes have been made: None  Labs/ tests ordered today include:     None   Orders Placed This Encounter  Procedures   EKG 12-Lead       Disposition:   FU with as needed.     Signed, Minus Breeding, MD  03/13/2021 10:04 AM    [place]

## 2021-03-13 ENCOUNTER — Ambulatory Visit: Payer: Medicare HMO | Admitting: Cardiology

## 2021-03-13 ENCOUNTER — Other Ambulatory Visit: Payer: Self-pay

## 2021-03-13 ENCOUNTER — Encounter: Payer: Self-pay | Admitting: Cardiology

## 2021-03-13 VITALS — BP 138/80 | HR 75 | Ht 61.0 in | Wt 204.2 lb

## 2021-03-13 DIAGNOSIS — R5383 Other fatigue: Secondary | ICD-10-CM

## 2021-03-13 DIAGNOSIS — I160 Hypertensive urgency: Secondary | ICD-10-CM | POA: Diagnosis not present

## 2021-03-13 NOTE — Patient Instructions (Signed)
Medication Instructions:  The current medical regimen is effective;  continue present plan and medications.  *If you need a refill on your cardiac medications before your next appointment, please call your pharmacy*   Follow-Up: At [facility], you and your health needs are our priority.  As part of our continuing mission to provide you with exceptional heart care, we have created designated Provider Care Teams.  These Care Teams include your primary Cardiologist (physician) and Advanced Practice Providers (APPs -  Physician Assistants and Nurse Practitioners) who all work together to provide you with the care you need, when you need it.  We recommend signing up for the patient portal called "MyChart".  Sign up information is provided on this After Visit Summary.  MyChart is used to connect with patients for Virtual Visits (Telemedicine).  Patients are able to view lab/test results, encounter notes, upcoming appointments, etc.  Non-urgent messages can be sent to your provider as well.   To learn more about what you can do with MyChart, go to [email or web address].    Your next appointment:   As needed  The format for your next appointment:   In Person  Provider:   James Hochrein, MD      

## 2021-05-10 ENCOUNTER — Telehealth: Payer: Self-pay | Admitting: Cardiology

## 2021-05-10 NOTE — Telephone Encounter (Signed)
Patient calling to discuss getting a heart monitor. Her pcp thinks she needs to do it. Please advise

## 2021-05-10 NOTE — Telephone Encounter (Signed)
Returned call to pt she states her HR has been over 100 and she is getting worried. I asked her for her recent BP/HR today 134/87 HR 102 Yesterday 134/77 HR 74 Wed  142/80 HR 67 Tues 154/88  HR 77 Explained to pt that these numbers are not too high. She states that she cannot find the ones that were high since we are on the phone. She states that if her BP gets lower than 140/90 she gets dizzy and has a headache. She wants to know if this is ok. She feels much better if it is higher. She will call back if this continues. I will forward to DR Hochrein;s review.

## 2021-05-14 ENCOUNTER — Telehealth: Payer: Self-pay | Admitting: Cardiology

## 2021-05-14 NOTE — Telephone Encounter (Signed)
Patient was returning call. Please advise ?

## 2021-05-14 NOTE — Telephone Encounter (Signed)
LM2CB, Scheduled APP appt 1-27 at 1005am w/Jesse Cleaver, FNP to discuss BP's make sure to bring BP logs to appt's.

## 2021-05-14 NOTE — Telephone Encounter (Signed)
Called patient, advised of message of upcoming appointment.  Patient aware to take BP/HR log and bring to appointment.   Will route to nurse that called to make aware.  Thanks!

## 2021-05-20 NOTE — Telephone Encounter (Signed)
LM2CB AGAIN

## 2021-05-29 NOTE — Telephone Encounter (Signed)
LM2CB 

## 2021-06-07 ENCOUNTER — Ambulatory Visit: Payer: Medicare HMO | Admitting: General Practice

## 2021-06-11 NOTE — Progress Notes (Incomplete)
[facility]  [address] [place],  [place]  [address] [phone]  Clinic Day:  06/11/2021  Referring physician: Imagene Riches, NP   This document serves as a record of services personally performed by Hosie Poisson, MD. It was created on their behalf by Curry,Lauren E, a trained medical scribe. The creation of this record is based on the scribe's personal observations and the provider's statements to them.  CHIEF COMPLAINT:  CC: History of stage IIIA triple negative breast cancer  Current Treatment:  Surveillance   HISTORY OF PRESENT ILLNESS:  Danielle Contreras is a 51 y.o. female with a history of stage IIIA triple negative breast cancer diagnosed at age 11 in July 2003. She was treated with a right modified radical mastectomy, followed by adjuvant chemotherapy with Adriamycin and cyclophosphamide for 3 months and Taxol for 3 months.  She has had a total abdominal hysterectomy and bilateral salpingo-oophorectomy for endometriosis.  At her visit in 2016, there was concern over an area of thickening in the left breast, as well as increased pain.  She underwent a diagnostic left breast mammogram with ultrasound, as well as CT chest and bone scan.  There was no evidence of malignancy on mammogram and ultrasound the breast or CT chest.  There were degenerative changes on the bone scan, but no evidence of malignancy.  Her mother had bilateral breast cancer at 23 and then at 80, a maternal aunt had breast cancer at uncertain age, a maternal niece had cervical cancer at an uncertain age, and a maternal uncle died of an uncertain malignancy at an uncertain age.  She did have genetic testing and this was negative.  She had a CT of the brain in February 2019 which was negative, and a thyroid ultrasound which was negative for thyroid nodules.  In March she had an ultrasound of the carotid arteries which revealed mild arteriosclerosis and less than 50%  stenosis.  In June, she had an MRI of the cervical spine because of severe pain and numbness of the left arm.  This showed mild degenerative changes but no stenosis, although she did have small left-sided disc protrusion at C6-7.  She also had a CT scan of abdomen and pelvis in June which was equivocal with some filling defects in the ascending colon and benign lipomatous infiltration of the ileocecal valve.  She has a small ventral hernia. There was a tiny cyst in the liver which appears benign. Her mother was diagnosed with pancreatic cancer and expired in 2020.  She had colonoscopy in 2020 with Dr. Melina Copa, and has chronic soft stool or diarrhea.  INTERVAL HISTORY:  [name] is here for annual follow up and states that she has been well.  Annual unilateral left mammogram from February 1st was was clear.  She had a lichenoid keratosis removed from the right mastectomy site by Dr. Noberto Retort last year.  She has had episodes of rectal bleeding and states that she was scheduled for colonoscopy with Dr. Melina Copa, but was told she would have to pay up front, which she has never had to do.  Therefore, this evaluation has not been done and will be rescheduled.  Hemoglobin has mildly decreased from 12.5 to 11.7, and her white count and platelets are normal.  Chemistries are unremarkable.  Her  appetite is good, and she has gained 20 pounds since her last visit.  She denies fever, chills or other signs of infection.  She denies nausea, vomiting, bowel  issues, or abdominal pain.  She denies sore throat, cough, dyspnea, or chest pain.  [name] is here for annual follow up ***. Annual unilateral left mammogram from February 2nd revealed a possible mass in the left breast. She is scheduled for diagnostic imaging on March 22nd.   Her  appetite is good, and she has gained/lost _ pounds since her last visit.  She denies fever, chills or other signs of infection.  She denies nausea, vomiting, bowel issues, or abdominal pain.   She denies sore throat, cough, dyspnea, or chest pain.  REVIEW OF SYSTEMS:  Review of Systems  Constitutional: Negative.  Negative for appetite change, chills, fatigue, fever and unexpected weight change.  HENT:  Negative.    Eyes: Negative.   Respiratory: Negative.  Negative for chest tightness, cough, hemoptysis, shortness of breath and wheezing.   Cardiovascular: Negative.  Negative for chest pain, leg swelling and palpitations.  Gastrointestinal: Negative.  Negative for abdominal distention, abdominal pain, blood in stool, constipation, diarrhea, nausea and vomiting.  Endocrine: Negative.   Genitourinary: Negative.  Negative for difficulty urinating, dysuria, frequency and hematuria.   Musculoskeletal: Negative.  Negative for arthralgias, back pain, flank pain, gait problem and myalgias.  Skin: Negative.   Neurological: Negative.  Negative for dizziness, extremity weakness, gait problem, headaches, light-headedness, numbness, seizures and speech difficulty.  Hematological: Negative.   Psychiatric/Behavioral: Negative.  Negative for depression and sleep disturbance. The patient is not nervous/anxious.     VITALS:  There were no vitals taken for this visit.  Wt Readings from Last 3 Encounters:  03/13/21 204 lb 3.2 oz (92.6 kg)  01/07/21 205 lb (93 kg)  06/15/20 212 lb (96.2 kg)    There is no height or weight on file to calculate BMI.  Performance status (ECOG): 1 - Symptomatic but completely ambulatory  PHYSICAL EXAM:  Physical Exam Constitutional:      General: She is not in acute distress.    Appearance: Normal appearance. She is normal weight.  HENT:     Head: Normocephalic and atraumatic.  Eyes:     General: No scleral icterus.    Extraocular Movements: Extraocular movements intact.     Conjunctiva/sclera: Conjunctivae normal.     Pupils: Pupils are equal, round, and reactive to light.  Cardiovascular:     Rate and Rhythm: Normal rate and regular rhythm.     Pulses:  Normal pulses.     Heart sounds: Normal heart sounds. No murmur heard.   No friction rub. No gallop.  Pulmonary:     Effort: Pulmonary effort is normal. No respiratory distress.     Breath sounds: Normal breath sounds.  Abdominal:     General: Bowel sounds are normal. There is no distension.     Palpations: Abdomen is soft. There is no hepatomegaly, splenomegaly or mass.     Tenderness: There is no abdominal tenderness.  Musculoskeletal:        General: Normal range of motion.     Cervical back: Normal range of motion and neck supple.     Right lower leg: No edema.     Left lower leg: No edema.  Lymphadenopathy:     Cervical: No cervical adenopathy.  Skin:    General: Skin is warm and dry.  Neurological:     General: No focal deficit present.     Mental Status: She is alert and oriented to person, place, and time. Mental status is at baseline.  Psychiatric:  Mood and Affect: Mood normal.        Behavior: Behavior normal.        Thought Content: Thought content normal.        Judgment: Judgment normal.    LABS:   CBC Latest Ref Rng & Units 06/15/2020 03/15/2008 03/15/2008  WBC - 6.1 7.1 -  Hemoglobin 12.0 - 16.0 11.7(A) 12.5 12.3  Hematocrit 36 - 46 34(A) 36.9 -  Platelets 150 - 399 320 336 -   CMP Latest Ref Rng & Units 06/15/2020 03/15/2008  Glucose 70 - 99 mg/dL - 104(H)  BUN 4 - 21 12 15   Creatinine 0.5 - 1.1 0.7 0.8  Sodium 137 - 147 139 147(H)  Potassium 3.4 - 5.3 3.5 4.9  Chloride 99 - 108 101 105  CO2 13 - 22 34(A) 32  Calcium 8.7 - 10.7 9.2 9.6  Total Protein 6.0 - 8.3 g/dL - 7.4  Total Bilirubin 0.3 - 1.2 mg/dL - 0.6  Alkaline Phos 25 - 125 116 100  AST 13 - 35 34 25  ALT 7 - 35 17 14    Lab Results  Component Value Date   IRONPCTSAT 11.1 (L) 03/15/2008   No results found for: LDH   STUDIES:    EXAM: 06/13/2021 DIGITAL SCREENING UNILATERAL LEFT MAMMOGRAM WITH CAD AND TOMOSYNTHESIS   TECHNIQUE:  Left screening digital craniocaudal and  mediolateral oblique  mammograms were obtained. Left screening digital breast  tomosynthesis was performed. The images were evaluated with  computer-aided detection.   COMPARISON: Previous exam(s).   ACR Breast Density Category b: There are scattered areas of  fibroglandular density.   FINDINGS:  In the left breast, a possible mass warrants further evaluation. In  the right breast, no findings suspicious for malignancy.   IMPRESSION:  Further evaluation is suggested for a possible mass in the left  breast.   Allergies:  Allergies  Allergen Reactions   Lac Bovis Diarrhea    Other reaction(s): Abdominal Pain, Abdominal Pain   Avelox [Moxifloxacin Hcl In Nacl]    Codeine    Diazepam    Hydrocodone    Hydrocodone-Acetaminophen    Hydrocodone-Acetaminophen    Levofloxacin    Levofloxacin    Moxifloxacin    Norethindrone    Oxycodone Hcl    Penicillins    Prednisone Swelling   Ropinirole Hydrochloride    Sulfonamide Derivatives    Topiramate    Corn Oil Other (See Comments)    unknown Other reaction(s): Abdominal Pain   Fish Oil Other (See Comments)    INTESTINAL Other reaction(s): Abdominal Pain    Current Medications: Current Outpatient Medications  Medication Sig Dispense Refill   albuterol (VENTOLIN HFA) 108 (90 Base) MCG/ACT inhaler      aspirin 81 MG EC tablet Take by mouth.     atorvastatin (LIPITOR) 20 MG tablet atorvastatin 20 mg tablet     Calcium 500 MG tablet Calcium 500     Calcium Carb-Cholecalciferol (CALCIUM PLUS VITAMIN D3 PO) Take 1 tablet by mouth daily.     cyclobenzaprine (FLEXERIL) 10 MG tablet cyclobenzaprine 10 mg tablet     dicyclomine (BENTYL) 20 MG tablet dicyclomine 20 mg tablet     gabapentin (NEURONTIN) 600 MG tablet Take 600 mg by mouth 3 (three) times daily.     meclizine (ANTIVERT) 25 MG tablet meclizine 25 mg tablet  TAKE 1 TAB(S) ORALLY EVERY 6 HOURS     metoprolol tartrate (LOPRESSOR) 25 MG tablet Take 12.5 mg by mouth  as  needed.     montelukast (SINGULAIR) 10 MG tablet montelukast 10 mg tablet     omeprazole (PRILOSEC) 40 MG capsule Take 40 mg by mouth daily.     ondansetron (ZOFRAN) 4 MG tablet ondansetron HCl 4 mg tablet  TAKE 1 TAB(S) ORALLY EVERY 6 HOURS AS NEEDED FOR NAUSEA AND VOMITING.     ondansetron (ZOFRAN-ODT) 4 MG disintegrating tablet ondansetron 4 mg disintegrating tablet  TAKE 1 TABLET BY MOUTH EVERY 6 HOURS AS NEEDED FOR NAUSEA/VOMITING     potassium chloride SA (KLOR-CON) 20 MEQ tablet potassium chloride ER 20 mEq tablet,extended release(part/cryst)     promethazine (PHENERGAN) 25 MG tablet Take 25 mg by mouth every 6 (six) hours as needed for nausea or vomiting.     traMADol (ULTRAM) 50 MG tablet Take 50 mg by mouth every 6 (six) hours as needed.       No current facility-administered medications for this visit.     ASSESSMENT & PLAN:   Assessment:   1.  Remote history of breast cancer, diagnosed in July 2003.  She remains without evidence of disease.  2.  Hypokalemia, resolved on oral supplement 20 meq daily.  3.  Lichenoid keratosis of the right mastectomy incision, excised by Dr. Noberto Retort.  4.  Rectal bleeding.  I encouraged her to follow up with the colonoscopy when she is able to do so.  Plan: I can see her back in 1 year with CBC and comprehensive metabolic profile and left mammogram.  She understands and agrees with this plan of care.   I provided 20 minutes of face-to-face time during this this encounter and > 50% was spent counseling as documented under my assessment and plan.    Derwood Kaplan, MD [facility] AT [facility] [address] [place] [address] Dept: [phone] Dept Fax: [phone]   I, Rita Ohara, am acting as scribe for Derwood Kaplan, MD  I have reviewed this report as typed by the medical scribe, and it is complete and accurate.

## 2021-06-17 ENCOUNTER — Ambulatory Visit: Payer: Medicare HMO | Admitting: Oncology

## 2021-06-17 ENCOUNTER — Other Ambulatory Visit: Payer: Medicare HMO

## 2021-06-19 ENCOUNTER — Encounter: Payer: Self-pay | Admitting: Oncology

## 2021-06-24 NOTE — Progress Notes (Incomplete)
[facility]  [address] [place],  [place]  [address] [phone]  Clinic Day:  06/24/2021  Referring physician: Imagene Riches, NP   This document serves as a record of services personally performed by Hosie Poisson, MD. It was created on their behalf by Curry,Lauren E, a trained medical scribe. The creation of this record is based on the scribe's personal observations and the provider's statements to them.  CHIEF COMPLAINT:  CC: History of stage IIIA triple negative breast cancer  Current Treatment:  Surveillance   HISTORY OF PRESENT ILLNESS:  Danielle Contreras is a 51 y.o. female with a history of stage IIIA triple negative breast cancer diagnosed at age 35 in July 2003. She was treated with a right modified radical mastectomy, followed by adjuvant chemotherapy with Adriamycin and cyclophosphamide for 3 months and Taxol for 3 months.  She has had a total abdominal hysterectomy and bilateral salpingo-oophorectomy for endometriosis.  At her visit in 2016, there was concern over an area of thickening in the left breast, as well as increased pain.  She underwent a diagnostic left breast mammogram with ultrasound, as well as CT chest and bone scan.  There was no evidence of malignancy on mammogram and ultrasound the breast or CT chest.  There were degenerative changes on the bone scan, but no evidence of malignancy.  Her mother had bilateral breast cancer at 97 and then at 45, a maternal aunt had breast cancer at uncertain age, a maternal niece had cervical cancer at an uncertain age, and a maternal uncle died of an uncertain malignancy at an uncertain age.  She did have genetic testing and this was negative.  She had a CT of the brain in February 2019 which was negative, and a thyroid ultrasound which was negative for thyroid nodules.  In March she had an ultrasound of the carotid arteries which revealed mild arteriosclerosis and less than 50%  stenosis.  In June, she had an MRI of the cervical spine because of severe pain and numbness of the left arm.  This showed mild degenerative changes but no stenosis, although she did have small left-sided disc protrusion at C6-7.  She also had a CT scan of abdomen and pelvis in June which was equivocal with some filling defects in the ascending colon and benign lipomatous infiltration of the ileocecal valve.  She has a small ventral hernia. There was a tiny cyst in the liver which appears benign. Her mother was diagnosed with pancreatic cancer and expired in 2020.  She had colonoscopy in 2020 with Dr. Melina Copa, and has chronic soft stool or diarrhea.  INTERVAL HISTORY:  Danielle Contreras is here for annual follow up and states that she has been well.  Annual unilateral left mammogram from February 1st was was clear.  She had a lichenoid keratosis removed from the right mastectomy site by Dr. Noberto Retort last year.  She has had episodes of rectal bleeding and states that she was scheduled for colonoscopy with Dr. Melina Copa, but was told she would have to pay up front, which she has never had to do.  Therefore, this evaluation has not been done and will be rescheduled.  Hemoglobin has mildly decreased from 12.5 to 11.7, and her white count and platelets are normal.  Chemistries are unremarkable.  Her  appetite is good, and she has gained 20 pounds since her last visit.  She denies fever, chills or other signs of infection.  She denies nausea, vomiting, bowel  issues, or abdominal pain.  She denies sore throat, cough, dyspnea, or chest pain.  [name] is here for routine follow up ***. Annual unilateral left mammogram from February 2nd revealed a possible mass in the left breast. Diagnostic imaging is scheduled for March 22nd.   Her  appetite is good, and she has gained/lost _ pounds since her last visit.  She denies fever, chills or other signs of infection.  She denies nausea, vomiting, bowel issues, or abdominal pain.  She  denies sore throat, cough, dyspnea, or chest pain.  REVIEW OF SYSTEMS:  Review of Systems  Constitutional: Negative.  Negative for appetite change, chills, fatigue, fever and unexpected weight change.  HENT:  Negative.    Eyes: Negative.   Respiratory: Negative.  Negative for chest tightness, cough, hemoptysis, shortness of breath and wheezing.   Cardiovascular: Negative.  Negative for chest pain, leg swelling and palpitations.  Gastrointestinal: Negative.  Negative for abdominal distention, abdominal pain, blood in stool, constipation, diarrhea, nausea and vomiting.  Endocrine: Negative.   Genitourinary: Negative.  Negative for difficulty urinating, dysuria, frequency and hematuria.   Musculoskeletal: Negative.  Negative for arthralgias, back pain, flank pain, gait problem and myalgias.  Skin: Negative.   Neurological: Negative.  Negative for dizziness, extremity weakness, gait problem, headaches, light-headedness, numbness, seizures and speech difficulty.  Hematological: Negative.   Psychiatric/Behavioral: Negative.  Negative for depression and sleep disturbance. The patient is not nervous/anxious.     VITALS:  There were no vitals taken for this visit.  Wt Readings from Last 3 Encounters:  03/13/21 204 lb 3.2 oz (92.6 kg)  01/07/21 205 lb (93 kg)  06/15/20 212 lb (96.2 kg)    There is no height or weight on file to calculate BMI.  Performance status (ECOG): 1 - Symptomatic but completely ambulatory  PHYSICAL EXAM:  Physical Exam Constitutional:      General: She is not in acute distress.    Appearance: Normal appearance. She is normal weight.  HENT:     Head: Normocephalic and atraumatic.  Eyes:     General: No scleral icterus.    Extraocular Movements: Extraocular movements intact.     Conjunctiva/sclera: Conjunctivae normal.     Pupils: Pupils are equal, round, and reactive to light.  Cardiovascular:     Rate and Rhythm: Normal rate and regular rhythm.     Pulses:  Normal pulses.     Heart sounds: Normal heart sounds. No murmur heard.   No friction rub. No gallop.  Pulmonary:     Effort: Pulmonary effort is normal. No respiratory distress.     Breath sounds: Normal breath sounds.  Abdominal:     General: Bowel sounds are normal. There is no distension.     Palpations: Abdomen is soft. There is no hepatomegaly, splenomegaly or mass.     Tenderness: There is no abdominal tenderness.  Musculoskeletal:        General: Normal range of motion.     Cervical back: Normal range of motion and neck supple.     Right lower leg: No edema.     Left lower leg: No edema.  Lymphadenopathy:     Cervical: No cervical adenopathy.  Skin:    General: Skin is warm and dry.  Neurological:     General: No focal deficit present.     Mental Status: She is alert and oriented to person, place, and time. Mental status is at baseline.  Psychiatric:        Mood and  Affect: Mood normal.        Behavior: Behavior normal.        Thought Content: Thought content normal.        Judgment: Judgment normal.    LABS:   CBC Latest Ref Rng & Units 06/15/2020 03/15/2008 03/15/2008  WBC - 6.1 7.1 -  Hemoglobin 12.0 - 16.0 11.7(A) 12.5 12.3  Hematocrit 36 - 46 34(A) 36.9 -  Platelets 150 - 399 320 336 -   CMP Latest Ref Rng & Units 06/15/2020 03/15/2008  Glucose 70 - 99 mg/dL - 104(H)  BUN 4 - 21 12 15   Creatinine 0.5 - 1.1 0.7 0.8  Sodium 137 - 147 139 147(H)  Potassium 3.4 - 5.3 3.5 4.9  Chloride 99 - 108 101 105  CO2 13 - 22 34(A) 32  Calcium 8.7 - 10.7 9.2 9.6  Total Protein 6.0 - 8.3 g/dL - 7.4  Total Bilirubin 0.3 - 1.2 mg/dL - 0.6  Alkaline Phos 25 - 125 116 100  AST 13 - 35 34 25  ALT 7 - 35 17 14   Lab Results  Component Value Date   IRONPCTSAT 11.1 (L) 03/15/2008   No results found for: LDH   STUDIES:    EXAM: 06/13/2021 DIGITAL SCREENING UNILATERAL LEFT MAMMOGRAM WITH CAD AND  TOMOSYNTHESIS   TECHNIQUE:  Left screening digital craniocaudal and  mediolateral oblique  mammograms were obtained. Left screening digital breast  tomosynthesis was performed. The images were evaluated with  computer-aided detection.   COMPARISON: Previous exam(s).   ACR Breast Density Category b: There are scattered areas of  fibroglandular density.   FINDINGS:  In the left breast, a possible mass warrants further evaluation. In  the right breast, no findings suspicious for malignancy.   IMPRESSION:  Further evaluation is suggested for a possible mass in the left  breast.   Allergies:  Allergies  Allergen Reactions   Lac Bovis Diarrhea    Other reaction(s): Abdominal Pain, Abdominal Pain   Avelox [Moxifloxacin Hcl In Nacl]    Codeine    Diazepam    Hydrocodone    Hydrocodone-Acetaminophen    Hydrocodone-Acetaminophen    Levofloxacin    Levofloxacin    Moxifloxacin    Norethindrone    Oxycodone Hcl    Penicillins    Prednisone Swelling   Ropinirole Hydrochloride    Sulfonamide Derivatives    Topiramate    Corn Oil Other (See Comments)    unknown Other reaction(s): Abdominal Pain   Fish Oil Other (See Comments)    INTESTINAL Other reaction(s): Abdominal Pain    Current Medications: Current Outpatient Medications  Medication Sig Dispense Refill   albuterol (VENTOLIN HFA) 108 (90 Base) MCG/ACT inhaler      aspirin 81 MG EC tablet Take by mouth.     atorvastatin (LIPITOR) 20 MG tablet atorvastatin 20 mg tablet     Calcium 500 MG tablet Calcium 500     Calcium Carb-Cholecalciferol (CALCIUM PLUS VITAMIN D3 PO) Take 1 tablet by mouth daily.     cyclobenzaprine (FLEXERIL) 10 MG tablet cyclobenzaprine 10 mg tablet     dicyclomine (BENTYL) 20 MG tablet dicyclomine 20 mg tablet     gabapentin (NEURONTIN) 600 MG tablet Take 600 mg by mouth 3 (three) times daily.     meclizine (ANTIVERT) 25 MG tablet meclizine 25 mg tablet  TAKE 1 TAB(S) ORALLY EVERY 6 HOURS     metoprolol tartrate (LOPRESSOR) 25 MG tablet Take 12.5 mg by mouth as  needed.     montelukast (SINGULAIR) 10 MG tablet montelukast 10 mg tablet     omeprazole (PRILOSEC) 40 MG capsule Take 40 mg by mouth daily.     ondansetron (ZOFRAN) 4 MG tablet ondansetron HCl 4 mg tablet  TAKE 1 TAB(S) ORALLY EVERY 6 HOURS AS NEEDED FOR NAUSEA AND VOMITING.     ondansetron (ZOFRAN-ODT) 4 MG disintegrating tablet ondansetron 4 mg disintegrating tablet  TAKE 1 TABLET BY MOUTH EVERY 6 HOURS AS NEEDED FOR NAUSEA/VOMITING     potassium chloride SA (KLOR-CON) 20 MEQ tablet potassium chloride ER 20 mEq tablet,extended release(part/cryst)     promethazine (PHENERGAN) 25 MG tablet Take 25 mg by mouth every 6 (six) hours as needed for nausea or vomiting.     traMADol (ULTRAM) 50 MG tablet Take 50 mg by mouth every 6 (six) hours as needed.       No current facility-administered medications for this visit.     ASSESSMENT & PLAN:   Assessment:   1.  Remote history of breast cancer, diagnosed in July 2003.  She remains without evidence of disease.  2.  Hypokalemia, resolved on oral supplement 20 meq daily.  3.  Lichenoid keratosis of the right mastectomy incision, excised by Dr. Noberto Retort.  4.  Rectal bleeding.  I encouraged her to follow up with the colonoscopy when she is able to do so.  5.  Possible mass of the left breast. Diagnostic imaging is scheduled for March 22nd.  Plan: I can see her back in 1 year with CBC and comprehensive metabolic profile and left mammogram.  She understands and agrees with this plan of care.   I provided 20 minutes of face-to-face time during this this encounter and > 50% was spent counseling as documented under my assessment and plan.    Derwood Kaplan, MD [facility] AT [facility] [address] [place] [address] Dept: [phone] Dept Fax: [phone]   I, Rita Ohara, am acting as scribe for Derwood Kaplan, MD  I have reviewed this report as typed by the medical  scribe, and it is complete and accurate.

## 2021-06-26 ENCOUNTER — Other Ambulatory Visit: Payer: Self-pay | Admitting: Oncology

## 2021-06-26 DIAGNOSIS — C50911 Malignant neoplasm of unspecified site of right female breast: Secondary | ICD-10-CM

## 2021-06-28 ENCOUNTER — Ambulatory Visit: Payer: Medicare HMO | Admitting: Oncology

## 2021-06-28 ENCOUNTER — Other Ambulatory Visit: Payer: Medicare HMO

## 2021-06-28 DIAGNOSIS — C50911 Malignant neoplasm of unspecified site of right female breast: Secondary | ICD-10-CM

## 2021-07-03 ENCOUNTER — Other Ambulatory Visit: Payer: Self-pay

## 2021-07-08 ENCOUNTER — Other Ambulatory Visit: Payer: Self-pay

## 2021-07-08 ENCOUNTER — Encounter: Payer: Self-pay | Admitting: Hematology and Oncology

## 2021-07-08 ENCOUNTER — Inpatient Hospital Stay: Payer: Medicare HMO | Attending: Hematology and Oncology | Admitting: Hematology and Oncology

## 2021-07-08 ENCOUNTER — Inpatient Hospital Stay: Payer: Medicare HMO

## 2021-07-08 ENCOUNTER — Ambulatory Visit: Payer: Medicare HMO | Admitting: Cardiology

## 2021-07-08 DIAGNOSIS — C50911 Malignant neoplasm of unspecified site of right female breast: Secondary | ICD-10-CM | POA: Diagnosis not present

## 2021-07-08 DIAGNOSIS — R928 Other abnormal and inconclusive findings on diagnostic imaging of breast: Secondary | ICD-10-CM | POA: Diagnosis not present

## 2021-07-08 LAB — COMPREHENSIVE METABOLIC PANEL
Albumin: 4.4 (ref 3.5–5.0)
Calcium: 9.3 (ref 8.7–10.7)

## 2021-07-08 LAB — HEPATIC FUNCTION PANEL
ALT: 19 (ref 7–35)
AST: 35 (ref 13–35)
Alkaline Phosphatase: 90 (ref 25–125)
Bilirubin, Total: 0.4

## 2021-07-08 LAB — CBC AND DIFFERENTIAL
HCT: 40 (ref 36–46)
Hemoglobin: 13.8 (ref 12.0–16.0)
Neutrophils Absolute: 4.76
Platelets: 383 (ref 150–399)
WBC: 8.5

## 2021-07-08 LAB — BASIC METABOLIC PANEL
BUN: 12 (ref 4–21)
CO2: 35 — AB (ref 13–22)
Chloride: 98 — AB (ref 99–108)
Creatinine: 0.9 (ref 0.5–1.1)
Glucose: 100
Potassium: 3.6 (ref 3.4–5.3)
Sodium: 139 (ref 137–147)

## 2021-07-08 LAB — CBC
MCV: 88 (ref 81–99)
RBC: 4.5 (ref 3.87–5.11)

## 2021-07-08 NOTE — Progress Notes (Signed)
[place]  [address] [place],  [place]  [address] [phone]  Clinic Day:  07/08/2021  Referring physician: Imagene Riches, NP  ASSESSMENT & PLAN:   Assessment & Plan: Breast cancer, stage 3, right [facility]) Remote history of stage IIIA triple negative breast cancer diagnosed in July 2003. She was treated right modified mastectomy and adjuvant chemotherapy with Adriamycin/cyclophosphamide followed by paclitaxel.  She remains without evidence of recurrence.  As long as her diagnostic left mammogram does not reveal any evidence of malignancy, we will plan to see her back in 1 year with a CBC, comprehensive metabolic panel and left screening mammogram.  Abnormality of left breast on screening mammogram Left screening mammogram on February 2nd revealed a possible mass in the left breast.  Diagnostic mammogram and possibly ultrasound were recommended.  We were not notified of the abnormality.  The patient states she received a letter last week and just opened it this morning advising her to contact our office for further imaging.  I will arrange for left diagnostic mammogram and possible ultrasound as soon as possible.  I will plan follow-up on these results.   The patient understands the plans discussed today and is in agreement with them.  She knows to contact our office if she develops concerns prior to her next appointment.     Marvia Pickles, PA-C  [facility] AT [facility] [address] [place] [address] Dept: [phone] Dept Fax: [phone]   Orders Placed This Encounter  Procedures   MM Digital Diagnostic Unilat L    Standing Status:   Future    Standing Expiration Date:   07/08/2022    Scheduling Instructions:     RH    Order Specific Question:   Reason for exam:    Answer:   abnormal left screening mammogram    Order Specific Question:   Preferred imaging location?    Answer:    External   US BREAST ASPIRATION LEFT    RH    Standing Status:   Future    Standing Expiration Date:   07/08/2022    Order Specific Question:   Reason for Exam (SYMPTOM  OR DIAGNOSIS REQUIRED)    Answer:   abnormal left screening mammogram    Order Specific Question:   Preferred imaging location?    Answer:   External   CBC and differential    This external order was created through the Results Console.   CBC    This external order was created through the Results Console.   CBC    This order was created through External Result Entry      CHIEF COMPLAINT:  CC: Remote history of stage IIIA triple negative breast cancer  Current Treatment:  Observation  HISTORY OF PRESENT ILLNESS:  Danielle Contreras is a 51 year old female with a history of stage IIIA triple negative breast cancer diagnosed at age 67 in July 2003. She was treated with a right modified radical mastectomy, followed by adjuvant chemotherapy with Adriamycin and cyclophosphamide for 3 months and Taxol for 3 months.  She has had a total abdominal hysterectomy and bilateral salpingo-oophorectomy for endometriosis.  [facility] myRisk Hereditary Cancer Panel in December 2016 did not reveal any clinically significant mutation or variants of uncertain significance.  At her visit in 2016, there was concern over an area of thickening in the left breast, as well as increased pain.  She underwent a  diagnostic left breast mammogram with ultrasound, as well as CT chest and bone scan.  There was no evidence of malignancy on mammogram and ultrasound the breast or CT chest.  There were degenerative changes on the bone scan, but no evidence of malignancy.  In February 2019, CT brain was negative.  Thyroid ultrasound at that time was negative for thyroid nodules.  In March, she had an ultrasound of the carotid arteries which revealed mild arteriosclerosis and less than 50% stenosis.  In June, she had an MRI cervical spine because of severe  pain and numbness of the left arm.  This showed mild degenerative changes but no stenosis, although she did have small left-sided disc protrusion at C6-7.  She had a CT scan of abdomen/pelvis in June, which was equivocal with some filling defects in the ascending colon and benign lipomatous infiltration of the ileocecal valve.  She has a small ventral hernia. There was a tiny lesion in the liver which appeared benign.  She has had imaging of multiple areas over the years.   Her mother was diagnosed with pancreatic cancer and expired in 2020.  CT abdomen/pelvis in January 2020 done for flank pain did not reveal any acute abnormality. There was a stable sub-centimeter low-attenuation lesion of the liver, likely a tiny cyst.  No pancreatic mass was identified. CT abdomen/pelvis in June 2021 once again did not reveal any acute abnormality or pancreatic lesion. The liver cyst was stable. She reported episodes of rectal bleeding was scheduled for colonoscopy with Dr. Melina Copa, but she has never had this done due to cost.  She had a lichenoid keratosis removed from the right mastectomy site by Dr. Noberto Retort in 2021.  CT abdomen/pelvis in May 2022 did not reveal any acute abnormality or pancreatic lesion.  Annual left screening mammograms have not revealed any evidence of malignancy.   INTERVAL HISTORY:  Danielle Contreras is here today for repeat clinical assessment and states she has been doing fairly well.  She states there is an area of the left breast we have been watching that feels larger to her.  She denies any changes in her right mastectomy site.  She states she has been doing fairly well.  She denies any changes in her mastectomy site or left breast. She denies fevers or chills. She reports chronic back pain, which is stable. Her appetite is good. Her weight has decreased 10 pounds over last year .  Prior to her visit, she underwent left screening mammogram.  This revealed a possible abnormality in the left breast and  diagnostic mammogram and possible ultrasound recommended.  We were not notified of the results.  The patient states she received a letter last week and just opened it this morning advising her to contact us for further imaging.  She is scheduled for a bone density scan next month.  REVIEW OF SYSTEMS:  Review of Systems  Constitutional:  Negative for appetite change, chills, fatigue, fever and unexpected weight change.  HENT:   Negative for lump/mass, mouth sores and sore throat.   Respiratory:  Negative for cough and shortness of breath.   Cardiovascular:  Negative for chest pain and leg swelling.  Gastrointestinal:  Negative for abdominal pain, constipation, diarrhea, nausea and vomiting.  Endocrine: Negative for hot flashes.  Genitourinary:  Negative for difficulty urinating, dysuria, frequency and hematuria.   Musculoskeletal:  Positive for back pain (Chronic, stable). Negative for arthralgias and myalgias.  Skin:  Negative for rash.  Neurological:  Negative for dizziness and  headaches.  Hematological:  Negative for adenopathy. Does not bruise/bleed easily.  Psychiatric/Behavioral:  Negative for depression and sleep disturbance. The patient is not nervous/anxious.     VITALS:  Blood pressure (!) 159/71, pulse 75, temperature 99.4 F (37.4 C), temperature source Oral, resp. rate 18, height 5' (1.524 m), weight 194 lb 9.6 oz (88.3 kg), SpO2 94 %.  Wt Readings from Last 3 Encounters:  07/08/21 194 lb 9.6 oz (88.3 kg)  03/13/21 204 lb 3.2 oz (92.6 kg)  01/07/21 205 lb (93 kg)    Body mass index is 38.01 kg/m.  Performance status (ECOG): 1 - Symptomatic but completely ambulatory  PHYSICAL EXAM:  Physical Exam Vitals and nursing note reviewed.  Constitutional:      General: She is not in acute distress.    Appearance: Normal appearance.  HENT:     Head: Normocephalic and atraumatic.     Mouth/Throat:     Mouth: Mucous membranes are moist.     Pharynx: Oropharynx is clear. No  oropharyngeal exudate or posterior oropharyngeal erythema.  Eyes:     General: No scleral icterus.    Extraocular Movements: Extraocular movements intact.     Conjunctiva/sclera: Conjunctivae normal.     Pupils: Pupils are equal, round, and reactive to light.  Cardiovascular:     Rate and Rhythm: Normal rate and regular rhythm.     Heart sounds: Normal heart sounds. No murmur heard.   No friction rub. No gallop.  Pulmonary:     Effort: Pulmonary effort is normal.     Breath sounds: Normal breath sounds. No wheezing, rhonchi or rales.  Chest:  Breasts:    Right: Absent.     Left: Mass (Large area of fullness and firmness in the left lateral breast at 8-9 o'clock) present. No swelling, bleeding, inverted nipple, nipple discharge, skin change or tenderness.     Comments: Right mastectomy site is negative Abdominal:     General: There is no distension.     Palpations: Abdomen is soft. There is no hepatomegaly, splenomegaly or mass.     Tenderness: There is no abdominal tenderness.  Musculoskeletal:        General: Normal range of motion.     Cervical back: Normal range of motion and neck supple. No tenderness.     Right lower leg: No edema.     Left lower leg: No edema.  Lymphadenopathy:     Cervical: No cervical adenopathy.     Upper Body:     Right upper body: No supraclavicular or axillary adenopathy.     Left upper body: No supraclavicular or axillary adenopathy.     Lower Body: No right inguinal adenopathy. No left inguinal adenopathy.  Skin:    General: Skin is warm and dry.     Coloration: Skin is not jaundiced.     Findings: No rash.  Neurological:     Mental Status: She is alert and oriented to person, place, and time.     Cranial Nerves: No cranial nerve deficit.  Psychiatric:        Mood and Affect: Mood normal.        Behavior: Behavior normal.        Thought Content: Thought content normal.    LABS:   CBC Latest Ref Rng & Units 07/08/2021 06/15/2020 03/15/2008   WBC - 8.5 6.1 7.1  Hemoglobin 12.0 - 16.0 13.8 11.7(A) 12.5  Hematocrit 36 - 46 40 34(A) 36.9  Platelets 150 - 399 383 320  336   CMP Latest Ref Rng & Units 07/08/2021 06/15/2020 03/15/2008  Glucose 70 - 99 mg/dL - - 104(H)  BUN 4 - 21 12 12 15   Creatinine 0.5 - 1.1 0.9 0.7 0.8  Sodium 137 - 147 139 139 147(H)  Potassium 3.4 - 5.3 3.6 3.5 4.9  Chloride 99 - 108 98(A) 101 105  CO2 13 - 22 35(A) 34(A) 32  Calcium 8.7 - 10.7 9.3 9.2 9.6  Total Protein 6.0 - 8.3 g/dL - - 7.4  Total Bilirubin 0.3 - 1.2 mg/dL - - 0.6  Alkaline Phos 25 - 125 90 116 100  AST 13 - 35 35 34 25  ALT 7 - 35 19 17 14      No results found for: CEA1 / No results found for: CEA1 No results found for: PSA1 No results found for: BHA193 No results found for: CAN125  No results found for: TOTALPROTELP, ALBUMINELP, A1GS, A2GS, BETS, BETA2SER, GAMS, MSPIKE, SPEI Lab Results  Component Value Date   IRONPCTSAT 11.1 (L) 03/15/2008   No results found for: LDH  STUDIES:  No results found.    HISTORY:   Past Medical History:  Diagnosis Date   Anxiety    Asthma    Bipolar 1 disorder ([place])    Breast cancer ([place])    Depression    Fibrocystic breast    Fibromyalgia    Gastritis    GERD (gastroesophageal reflux disease)    Headache(784.0)    Heel spur    RIGHT   Hyperlipidemia    IBS (irritable bowel syndrome)    Lymphedema    right arm   Migraines    OSA (obstructive sleep apnea)    Current CPAP is not working   Panic attacks    RLS (restless legs syndrome)     Past Surgical History:  Procedure Laterality Date   ABDOMINAL HYSTERECTOMY  2005   BREAST LUMPECTOMY     Bilateral    KNEE SURGERY     LAPAROSCOPY  2005   MASTECTOMY     Right   OOPHORECTOMY     TONSILLECTOMY      Family History  Problem Relation Age of Onset   Coronary artery disease Brother        Vague history   Hypertension Brother    Stroke Maternal Grandmother    Heart disease Maternal Grandmother    Diabetes Mother         and Father    Breast cancer Mother    Hyperlipidemia Mother    Pancreatic cancer Mother    Diabetes Father    Hypertension Father    Hyperlipidemia Father    Colon cancer Neg Hx     Social History:  reports that she has never smoked. She has never used smokeless tobacco. She reports that she does not drink alcohol and does not use drugs.The patient is alone today.  Allergies:  Allergies  Allergen Reactions   Lac Bovis Diarrhea    Other reaction(s): Abdominal Pain, Abdominal Pain   Avelox [Moxifloxacin Hcl In Nacl]    Codeine    Diazepam    Hydrocodone    Hydrocodone-Acetaminophen    Hydrocodone-Acetaminophen    Levofloxacin    Levofloxacin    Moxifloxacin    Norethindrone    Oxycodone Hcl    Penicillins    Prednisone Swelling   Ropinirole Hydrochloride    Sulfonamide Derivatives    Topiramate    Corn Oil Other (See Comments)    unknown  Other reaction(s): Abdominal Pain   Fish Oil Other (See Comments)    INTESTINAL Other reaction(s): Abdominal Pain    Current Medications: Current Outpatient Medications  Medication Sig Dispense Refill   albuterol (VENTOLIN HFA) 108 (90 Base) MCG/ACT inhaler      amLODipine (NORVASC) 2.5 MG tablet Take 2.5 mg by mouth daily.     aspirin 81 MG EC tablet Take by mouth.     atorvastatin (LIPITOR) 40 MG tablet Take 40 mg by mouth daily.     Calcium 500 MG tablet Calcium 500     Calcium Carb-Cholecalciferol (CALCIUM PLUS VITAMIN D3 PO) Take 1 tablet by mouth daily.     cyclobenzaprine (FLEXERIL) 10 MG tablet cyclobenzaprine 10 mg tablet     dicyclomine (BENTYL) 20 MG tablet dicyclomine 20 mg tablet     escitalopram (LEXAPRO) 10 MG tablet Take 10 mg by mouth daily.     gabapentin (NEURONTIN) 600 MG tablet Take 600 mg by mouth 3 (three) times daily.     magnesium oxide (MAG-OX) 400 (240 Mg) MG tablet Take 1 tablet by mouth daily.     meclizine (ANTIVERT) 25 MG tablet meclizine 25 mg tablet  TAKE 1 TAB(S) ORALLY EVERY 6 HOURS      metFORMIN (GLUCOPHAGE-XR) 500 MG 24 hr tablet Take 500 mg by mouth daily.     metoprolol tartrate (LOPRESSOR) 25 MG tablet Take 12.5 mg by mouth as needed.     montelukast (SINGULAIR) 10 MG tablet montelukast 10 mg tablet     omeprazole (PRILOSEC) 40 MG capsule Take 40 mg by mouth daily.     ondansetron (ZOFRAN) 4 MG tablet ondansetron HCl 4 mg tablet  TAKE 1 TAB(S) ORALLY EVERY 6 HOURS AS NEEDED FOR NAUSEA AND VOMITING.     ondansetron (ZOFRAN-ODT) 4 MG disintegrating tablet ondansetron 4 mg disintegrating tablet  TAKE 1 TABLET BY MOUTH EVERY 6 HOURS AS NEEDED FOR NAUSEA/VOMITING     potassium chloride SA (KLOR-CON) 20 MEQ tablet potassium chloride ER 20 mEq tablet,extended release(part/cryst)     promethazine (PHENERGAN) 25 MG tablet Take 25 mg by mouth every 6 (six) hours as needed for nausea or vomiting.     traMADol (ULTRAM) 50 MG tablet Take 50 mg by mouth every 6 (six) hours as needed.       VASCEPA 1 g capsule Take 2 g by mouth 2 (two) times daily.     No current facility-administered medications for this visit.

## 2021-07-08 NOTE — Assessment & Plan Note (Addendum)
Remote history of stage IIIA triple negative breast cancer diagnosed in July 2003. She was treated right modified mastectomy and adjuvant chemotherapy with Adriamycin/cyclophosphamide followed by paclitaxel.  She remains without evidence of recurrence.  As long as her diagnostic left mammogram does not reveal any evidence of malignancy, we will plan to see her back in 1 year with a CBC, comprehensive metabolic panel and left screening mammogram.

## 2021-07-08 NOTE — Assessment & Plan Note (Addendum)
Left screening mammogram on February 2nd revealed a possible mass in the left breast.  Diagnostic mammogram and possibly ultrasound were recommended.  We were not notified of the abnormality.  The patient states she received a letter last week and just opened it this morning advising her to contact our office for further imaging.  I will arrange for left diagnostic mammogram and possible ultrasound as soon as possible.  I will plan follow-up on these results.

## 2021-07-09 ENCOUNTER — Encounter: Payer: Self-pay | Admitting: Hematology and Oncology

## 2021-10-23 NOTE — Progress Notes (Signed)
[facility]  [address] [place],  [place]  [address] [phone]  Clinic Day:  10/25/2021  Referring physician: Imagene Riches, NP  ASSESSMENT & PLAN:   Assessment & Plan: Breast pain, left The patient has reported left breast pain in the past.  Left diagnostic mammogram and ultrasound in March revealed the small oval mass in the far left breast seen on screening mammogram in February 2 a normal intramammary lymph node.   There is persistent fullness in the left upper outer quadrant without discrete mass.  There is no palpable axillary adenopathy.  Her pain may simply be due to the weight of her breast.  Her lifetime risk of breast cancer is 20.9% using the IBIS calculation based on her family history.  Therefore, I recommend obtaining an MRI of the left breast for further evaluation.  Breast cancer, stage 3, right ([place]) Remote history of stage IIIA triple negative breast cancer diagnosed in July 2003. She was treated right modified mastectomy and adjuvant chemotherapy with Adriamycin/cyclophosphamide followed by paclitaxel.  She remains without evidence of recurrence.  She had testing for hereditary cancer syndromes in 2016, which was negative.  Testing has improved since that time, so I will have her see the genetic counselor again for her recommendations.    The patient understands the plans discussed today and is in agreement with them.  She knows to contact our office if she develops concerns prior to her next appointment.   I provided 30 minutes of face-to-face time during this encounter and > 50% was spent counseling as documented under my assessment and plan.    Marvia Pickles, Sargeant [address] [place] [address] Dept: [phone] Dept Fax: [phone]   Orders Placed This Encounter  Procedures   MR BREAST BILATERAL [address] [place] CAD     [facility]  [facility] MCR Epic ORDER NO COVID PF: 07/2021 [facility] DX:  BREAST PAIN CYCLE: N/A WT:195 HT: 5'1 NO NEEDS/NO CLAUS/ NO METAL REMOVED/ NO IMPLANTS/ NO BULLETS OR BB'S/ NO GLUCOSE MONITOR,  STIMULATOR OR INJECTORS DEFIBRULATOR NO PORT NO BRAIN CLIP /  PREV SX/ NO BRAIN HEART EYE OR EAR SX NKDA TO IV DYE CONTRAST DP AND DAMRIEZ 10-25-2021 ** PT AWARE OF 75$ NO SHOW FEE**     Standing Status:   Future    Standing Expiration Date:   10/25/2022    Order Specific Question:   If indicated for the ordered procedure, I authorize the administration of contrast media per Radiology protocol    Answer:   Yes    Order Specific Question:   What is the patient's sedation requirement?    Answer:   No Sedation    Order Specific Question:   Does the patient have a pacemaker or implanted devices?    Answer:   No    Order Specific Question:   Preferred imaging location?    Answer:   [facility] (table limit-550lbs)   CBC w Diff ([facility] scanned report) STAT    Standing Status:   Future    Number of Occurrences:   1    Standing Expiration Date:   10/26/2022   CMP ([facility] scanned report) STAT    Standing Status:   Future    Number of Occurrences:   1    Standing Expiration Date:   10/26/2022   CBC and differential  This external order was created through the Results Console.   CBC    This external order was created through the Results Console.   Basic metabolic panel    This external order was created through the Results Console.   Comprehensive metabolic panel    This external order was created through the Results Console.   Hepatic function panel    This external order was created through the Results Console.   CBC    This order was created through External Result Entry   Ambulatory referral to Genetics    Referral Priority:   Routine    Referral Type:   Consultation    Referral Reason:   Specialty Services Required    Number of Visits Requested:   1       CHIEF COMPLAINT:  CC: Left breast pain  Current Treatment: Evaluation  HISTORY OF PRESENT ILLNESS:  Danielle Contreras is a 51 year old female with a history of stage IIIA triple negative breast cancer diagnosed at age 65 in July 2003. She was treated with a right modified radical mastectomy, followed by adjuvant chemotherapy with Adriamycin and cyclophosphamide for 3 months and Taxol for 3 months.  She has had a total abdominal hysterectomy and bilateral salpingo-oophorectomy for endometriosis.  [facility] myRisk Hereditary Cancer Panel in December 2016 did not reveal any clinically significant mutation or variants of uncertain significance.  Her mother had bilateral breast cancer at age 34 and age 75.  A maternal aunt also had breast cancer at an unknown age.  Her calculated lifetime risk based on the family history is 20.9%.   At her visit in 2016, there was concern over an area of thickening in the left breast, as well as increased pain.  She underwent a diagnostic left breast mammogram with ultrasound, as well as CT chest and bone scan.  There was no evidence of malignancy on mammogram and ultrasound the breast or CT chest.  There were degenerative changes on the bone scan, but no evidence of malignancy.  In February 2019, CT brain was negative.  Thyroid ultrasound at that time was negative for thyroid nodules.  In March, she had an ultrasound of the carotid arteries which revealed mild arteriosclerosis and less than 50% stenosis.  In June, she had an MRI cervical spine because of severe pain and numbness of the left arm.  This showed mild degenerative changes but no stenosis, although she did have small left-sided disc protrusion at C6-7.  She had a CT scan of abdomen/pelvis in June, which was equivocal with some filling defects in the ascending colon and benign lipomatous infiltration of the ileocecal valve.  She has a small ventral hernia. There was a tiny lesion in the liver which  appeared benign.  She has had imaging of multiple areas over the years.   Her mother was diagnosed with pancreatic cancer and expired in 2020.   CT abdomen/pelvis in January 2020 done for flank pain did not reveal any acute abnormality. There was a stable sub-centimeter low-attenuation lesion of the liver, likely a tiny cyst.  No pancreatic mass was identified. CT abdomen/pelvis in June 2021 once again did not reveal any acute abnormality or pancreatic lesion. The liver cyst was stable. She reported episodes of rectal bleeding was scheduled for colonoscopy with Dr. Melina Copa, but she has never had this done due to cost.  She had a lichenoid keratosis removed from the right mastectomy site by Dr. Noberto Retort in 2021.  CT abdomen/pelvis in May 2022  did not reveal any acute abnormality or pancreatic lesion.  Annual left screening mammograms have not revealed any evidence of malignancy.   Left screening mammogram on February 2nd revealed a possible mass in the left breast.  Diagnostic mammogram and possibly ultrasound were recommended.  This came to our attention at her follow-up visit on February 27.  Left diagnostic mammogram and ultrasound revealed the recently suspected left breast mass to be a normal intramammary lymph node.  There was no evidence of malignancy.  INTERVAL HISTORY:  Kieley is added to the schedule today as she telephoned reporting left breast pain.  This is not a new symptom, but she reports worsening.  Left diagnostic mammogram in March did not reveal any evidence of malignancy.  She states she wears her mastectomy bra and right breast prosthesis. She denies fevers or chills. She denies pain. Her appetite is good. Her weight has increased 1 pounds over last 4 months .  Bone density scan in March was normal.  REVIEW OF SYSTEMS:  Review of Systems  Constitutional:  Negative for appetite change, chills, fatigue, fever and unexpected weight change.  HENT:   Negative for lump/mass, mouth sores  and sore throat.   Respiratory:  Negative for cough and shortness of breath.   Cardiovascular:  Negative for chest pain and leg swelling.  Gastrointestinal:  Negative for abdominal pain, constipation, diarrhea, nausea and vomiting.  Endocrine: Negative for hot flashes.  Genitourinary:  Negative for difficulty urinating, dysuria, frequency and hematuria.   Musculoskeletal:  Negative for arthralgias, back pain and myalgias.  Skin:  Negative for rash.  Neurological:  Negative for dizziness and headaches.  Hematological:  Negative for adenopathy. Does not bruise/bleed easily.  Psychiatric/Behavioral:  Negative for depression and sleep disturbance. The patient is not nervous/anxious.      VITALS:  Blood pressure (!) 147/76, pulse 73, temperature 98.1 F (36.7 C), temperature source Oral, resp. rate 20, height 5' 1"  (1.549 m), weight 195 lb 1.6 oz (88.5 kg), SpO2 100 %.  Wt Readings from Last 3 Encounters:  10/25/21 195 lb 1.6 oz (88.5 kg)  07/08/21 194 lb 9.6 oz (88.3 kg)  03/13/21 204 lb 3.2 oz (92.6 kg)    Body mass index is 36.86 kg/m.  Performance status (ECOG): 1 - Symptomatic but completely ambulatory  PHYSICAL EXAM:  Physical Exam Vitals and nursing note reviewed.  Constitutional:      General: She is not in acute distress.    Appearance: Normal appearance.  HENT:     Head: Normocephalic and atraumatic.     Mouth/Throat:     Mouth: Mucous membranes are moist.     Pharynx: Oropharynx is clear. No oropharyngeal exudate or posterior oropharyngeal erythema.  Eyes:     General: No scleral icterus.    Extraocular Movements: Extraocular movements intact.     Conjunctiva/sclera: Conjunctivae normal.     Pupils: Pupils are equal, round, and reactive to light.  Cardiovascular:     Rate and Rhythm: Normal rate and regular rhythm.     Heart sounds: Normal heart sounds. No murmur heard.    No friction rub. No gallop.  Pulmonary:     Effort: Pulmonary effort is normal.     Breath  sounds: Normal breath sounds. No wheezing, rhonchi or rales.  Chest:  Breasts:    Right: Absent.  Abdominal:     General: There is no distension.     Palpations: Abdomen is soft. There is no hepatomegaly, splenomegaly or mass.  Tenderness: There is no abdominal tenderness.  Musculoskeletal:        General: Normal range of motion.     Cervical back: Normal range of motion and neck supple. No tenderness.     Right lower leg: No edema.     Left lower leg: No edema.  Lymphadenopathy:     Cervical: No cervical adenopathy.     Upper Body:     Right upper body: No supraclavicular or axillary adenopathy.     Left upper body: No supraclavicular or axillary adenopathy.     Lower Body: No right inguinal adenopathy. No left inguinal adenopathy.  Skin:    General: Skin is warm and dry.     Coloration: Skin is not jaundiced.     Findings: No rash.  Neurological:     Mental Status: She is alert and oriented to person, place, and time.     Cranial Nerves: No cranial nerve deficit.  Psychiatric:        Mood and Affect: Mood normal.        Behavior: Behavior normal.        Thought Content: Thought content normal.     LABS:      Latest Ref Rng & Units 10/25/2021   12:00 AM 07/08/2021   12:00 AM 06/15/2020   12:00 AM  CBC  WBC  6.6     8.5     6.1      Hemoglobin 12.0 - 16.0 11.9     13.8     11.7      Hematocrit 36 - 46 37     40     34      Platelets 150 - 400 K/uL 329     383     320         This result is from an external source.      Latest Ref Rng & Units 10/25/2021   12:00 AM 07/08/2021   12:00 AM 06/15/2020   12:00 AM  CMP  BUN 4 - 21 13     12  12       Creatinine 0.5 - 1.1 0.6     0.9  0.7      Sodium 137 - 147 140     139  139      Potassium 3.5 - 5.1 mEq/L 4.0     3.6  3.5      Chloride 99 - 108 96     98  101      CO2 13 - 22 34     35  34      Calcium 8.7 - 10.7 9.7     9.3  9.2      Alkaline Phos 25 - 125 97     90  116      AST 13 - 35 29     35  34      ALT 7 -  35 U/L 14     19  17          This result is from an external source.     No results found for: "CEA1", "CEA" / No results found for: "CEA1", "CEA" No results found for: "PSA1" No results found for: "XKP537" No results found for: "CAN125"  No results found for: "TOTALPROTELP", "ALBUMINELP", "A1GS", "A2GS", "BETS", "BETA2SER", "GAMS", "MSPIKE", "SPEI" Lab Results  Component Value Date   IRONPCTSAT 11.1 (L) 03/15/2008   No results found for: "LDH"  STUDIES:  No results found.    Exam(s): [id number] MAM/MAM DIGITAL W/TOMO DIAG L  ADDENDUM REPORT: 07/11/2021 13:53   ADDENDUM:  The 2nd impression should read as follows: No evidence of  malignancy.  Electronically Signed  By: Claudie Revering M.D.  On: 07/11/2021 13:53    CLINICAL DATA: Possible mass in the far posterior outer breast in  the oblique projection of a recent screening mammogram. Previous  right mastectomy for breast cancer   EXAM:  DIGITAL DIAGNOSTIC UNILATERAL LEFT MAMMOGRAM WITH TOMOSYNTHESIS AND  CAD; ULTRASOUND LEFT BREAST   TECHNIQUE:  Left digital diagnostic mammography and breast tomosynthesis was  performed. The images were evaluated with computer-aided detection.;  Targeted ultrasound examination of the left breast was performed.   COMPARISON: Previous exam(s).   ACR Breast Density Category b: There are scattered areas of  fibroglandular density.   FINDINGS:  The small, oval, circumscribed mass with a probable fatty notch in  the far posterior left breast could not be included on today's  images. No findings elsewhere in the breast suspicious for  malignancy.  Targeted ultrasound is performed, showing a 9 x 2 x 2 mm normal  intramammary lymph node in the 3 o'clock position of the left  breast, 18 cm from the nipple. This corresponds to the mammographic  mass.   IMPRESSION:  1. The recently suspected left breast mass is a normal intramammary  lymph node, not previously included due to its far  posterior  position.  2. Evidence of malignancy.   RECOMMENDATION:  Left screening mammogram in February 2024 when due.   HISTORY:   Past Medical History:  Diagnosis Date   Anxiety    Asthma    Bipolar 1 disorder ([place])    Breast cancer ([place])    Breast pain, left 10/25/2021   Depression    Fibrocystic breast    Fibromyalgia    Gastritis    GERD (gastroesophageal reflux disease)    Headache(784.0)    Heel spur    RIGHT   Hyperlipidemia    IBS (irritable bowel syndrome)    Lymphedema    right arm   Migraines    OSA (obstructive sleep apnea)    Current CPAP is not working   Panic attacks    RLS (restless legs syndrome)     Past Surgical History:  Procedure Laterality Date   ABDOMINAL HYSTERECTOMY  2005   BREAST LUMPECTOMY     Bilateral    KNEE SURGERY     LAPAROSCOPY  2005   MASTECTOMY     Right   OOPHORECTOMY     TONSILLECTOMY      Family History  Problem Relation Age of Onset   Coronary artery disease Brother        Vague history   Hypertension Brother    Stroke Maternal Grandmother    Heart disease Maternal Grandmother    Diabetes Mother        and Father    Breast cancer Mother    Hyperlipidemia Mother    Pancreatic cancer Mother    Diabetes Father    Hypertension Father    Hyperlipidemia Father    Colon cancer Neg Hx     Social History:  reports that she has never smoked. She has never used smokeless tobacco. She reports that she does not drink alcohol and does not use drugs.The patient is alone today.  Allergies:  Allergies  Allergen Reactions   Lac Bovis Diarrhea    Other reaction(s): Abdominal Pain, Abdominal Pain  Avelox [Moxifloxacin Hcl In Nacl]    Codeine    Diazepam    Hydrocodone    Hydrocodone-Acetaminophen    Hydrocodone-Acetaminophen    Levofloxacin    Levofloxacin    Moxifloxacin    Norethindrone    Other Other (See Comments)    INTESTINAL UPSET    Oxycodone Hcl    Penicillins    Prednisone Swelling   Ropinirole  Hydrochloride    Sulfonamide Derivatives    Topiramate    Corn Oil Other (See Comments)    unknown Other reaction(s): Abdominal Pain   Fish Oil Other (See Comments)    INTESTINAL Other reaction(s): Abdominal Pain    Current Medications: Current Outpatient Medications  Medication Sig Dispense Refill   albuterol (VENTOLIN HFA) 108 (90 Base) MCG/ACT inhaler      amLODipine (NORVASC) 2.5 MG tablet Take 2.5 mg by mouth daily.     aspirin 81 MG EC tablet Take by mouth.     atorvastatin (LIPITOR) 20 MG tablet Take 20 mg by mouth daily.     Calcium 500 MG tablet Calcium 500     Calcium Carb-Cholecalciferol (CALCIUM PLUS VITAMIN D3 PO) Take 1 tablet by mouth daily.     cyanocobalamin (,VITAMIN B-12,) 1000 MCG/ML injection Inject into the muscle.     cyclobenzaprine (FLEXERIL) 10 MG tablet cyclobenzaprine 10 mg tablet     dicyclomine (BENTYL) 20 MG tablet dicyclomine 20 mg tablet     escitalopram (LEXAPRO) 10 MG tablet Take 10 mg by mouth daily.     gabapentin (NEURONTIN) 600 MG tablet Take 600 mg by mouth 3 (three) times daily.     magnesium oxide (MAG-OX) 400 (240 Mg) MG tablet Take 1 tablet by mouth daily.     meclizine (ANTIVERT) 25 MG tablet meclizine 25 mg tablet  TAKE 1 TAB(S) ORALLY EVERY 6 HOURS     metFORMIN (GLUCOPHAGE-XR) 500 MG 24 hr tablet Take 500 mg by mouth daily.     metoprolol tartrate (LOPRESSOR) 25 MG tablet Take 12.5 mg by mouth as needed.     montelukast (SINGULAIR) 10 MG tablet montelukast 10 mg tablet     omeprazole (PRILOSEC) 40 MG capsule Take 40 mg by mouth daily.     ondansetron (ZOFRAN) 4 MG tablet ondansetron HCl 4 mg tablet  TAKE 1 TAB(S) ORALLY EVERY 6 HOURS AS NEEDED FOR NAUSEA AND VOMITING.     ondansetron (ZOFRAN-ODT) 4 MG disintegrating tablet ondansetron 4 mg disintegrating tablet  TAKE 1 TABLET BY MOUTH EVERY 6 HOURS AS NEEDED FOR NAUSEA/VOMITING     potassium chloride SA (KLOR-CON) 20 MEQ tablet potassium chloride ER 20 mEq tablet,extended  release(part/cryst)     traMADol (ULTRAM) 50 MG tablet Take 50 mg by mouth every 6 (six) hours as needed.       VASCEPA 1 g capsule Take 2 g by mouth 2 (two) times daily.     ZTLIDO 1.8 % PTCH SMARTSIG:1 Patch(s) Topical As Needed     No current facility-administered medications for this visit.

## 2021-10-25 ENCOUNTER — Inpatient Hospital Stay: Payer: Medicare HMO

## 2021-10-25 ENCOUNTER — Encounter: Payer: Self-pay | Admitting: Hematology and Oncology

## 2021-10-25 ENCOUNTER — Other Ambulatory Visit: Payer: Self-pay

## 2021-10-25 ENCOUNTER — Telehealth: Payer: Self-pay | Admitting: Hematology and Oncology

## 2021-10-25 ENCOUNTER — Other Ambulatory Visit: Payer: Self-pay | Admitting: Hematology and Oncology

## 2021-10-25 ENCOUNTER — Inpatient Hospital Stay: Payer: Medicare HMO | Attending: Hematology and Oncology | Admitting: Hematology and Oncology

## 2021-10-25 VITALS — BP 147/76 | HR 73 | Temp 98.1°F | Resp 20 | Ht 61.0 in | Wt 195.1 lb

## 2021-10-25 DIAGNOSIS — N644 Mastodynia: Secondary | ICD-10-CM | POA: Insufficient documentation

## 2021-10-25 DIAGNOSIS — C50911 Malignant neoplasm of unspecified site of right female breast: Secondary | ICD-10-CM

## 2021-10-25 HISTORY — DX: Mastodynia: N64.4

## 2021-10-25 LAB — CBC AND DIFFERENTIAL
HCT: 37 (ref 36–46)
Hemoglobin: 11.9 — AB (ref 12.0–16.0)
Neutrophils Absolute: 2.57
Platelets: 329 10*3/uL (ref 150–400)
WBC: 6.6

## 2021-10-25 LAB — BASIC METABOLIC PANEL
BUN: 13 (ref 4–21)
CO2: 34 — AB (ref 13–22)
Chloride: 96 — AB (ref 99–108)
Creatinine: 0.6 (ref 0.5–1.1)
Glucose: 91
Potassium: 4 mEq/L (ref 3.5–5.1)
Sodium: 140 (ref 137–147)

## 2021-10-25 LAB — HEPATIC FUNCTION PANEL
ALT: 14 U/L (ref 7–35)
AST: 29 (ref 13–35)
Alkaline Phosphatase: 97 (ref 25–125)
Bilirubin, Total: 0.4

## 2021-10-25 LAB — CBC
MCV: 89 (ref 81–99)
RBC: 4.14 (ref 3.87–5.11)

## 2021-10-25 LAB — COMPREHENSIVE METABOLIC PANEL
Albumin: 4.5 (ref 3.5–5.0)
Calcium: 9.7 (ref 8.7–10.7)

## 2021-10-25 NOTE — Telephone Encounter (Signed)
Notified pt and spouse of upcoming Breast MRI appt, location, and instructions.

## 2021-10-25 NOTE — Assessment & Plan Note (Addendum)
The patient has reported left breast pain in the past.  Left diagnostic mammogram and ultrasound in March revealed the small oval mass in the far left breast seen on screening mammogram in February 2 a normal intramammary lymph node.   There is persistent fullness in the left upper outer quadrant without discrete mass.  There is no palpable axillary adenopathy.  Her pain may simply be due to the weight of her breast.  Her lifetime risk of breast cancer is 20.9% using the IBIS calculation based on her family history.  Therefore, I recommend obtaining an MRI of the left breast for further evaluation.

## 2021-10-25 NOTE — Assessment & Plan Note (Addendum)
Remote history of stage IIIA triple negative breast cancer diagnosed in July 2003. She was treated right modified mastectomy and adjuvant chemotherapy with Adriamycin/cyclophosphamide followed by paclitaxel.  She remains without evidence of recurrence.  She had testing for hereditary cancer syndromes in 2016, which was negative.  Testing has improved since that time, so I will have her see the genetic counselor again for her recommendations.

## 2021-11-04 ENCOUNTER — Other Ambulatory Visit: Payer: Medicare HMO

## 2021-11-06 ENCOUNTER — Encounter: Payer: Self-pay | Admitting: Oncology

## 2021-11-19 ENCOUNTER — Ambulatory Visit
Admission: RE | Admit: 2021-11-19 | Discharge: 2021-11-19 | Disposition: A | Discharge status: Home / Self Care | Payer: Medicare HMO | Source: Ambulatory Visit | Attending: Hematology and Oncology | Admitting: Hematology and Oncology

## 2021-11-19 DIAGNOSIS — N644 Mastodynia: Secondary | ICD-10-CM

## 2021-11-19 MED ORDER — GADOBUTROL 1 MMOL/ML IV SOLN
9.0000 mL | Freq: Once | INTRAVENOUS | Status: AC | PRN
  Administered 2021-11-19: 9 mL via INTRAVENOUS

## 2021-12-05 ENCOUNTER — Telehealth: Payer: Self-pay | Admitting: Cardiology

## 2021-12-05 NOTE — Telephone Encounter (Signed)
STAT if HR is under 50 or over 120 (normal HR is 60-100 beats per minute)  What is your heart rate? It is running between 90-100+  Do you have a log of your heart rate readings (document readings)? No  Do you have any other symptoms?  Dizzy and not feeling well. She says her bp low for her but doesn't have exact numbers in front of her. She is requesting call back to discuss this. Her PCP also recommended her call her cardiologist.

## 2021-12-05 NOTE — Progress Notes (Signed)
Cardiology Clinic Note   Patient Name: Danielle Contreras Date of Encounter: 12/06/2021  Primary Care Provider:  Imagene Riches, NP Primary Cardiologist:  Minus Breeding, MD  Patient Profile    Danielle Contreras presents to the clinic today for evaluation of her increased heart rate, dizziness, and shortness of breath.  Past Medical History    Past Medical History:  Diagnosis Date   Anxiety    Asthma    Bipolar 1 disorder ([place])    Breast cancer ([place])    Breast pain, left 10/25/2021   Depression    Fibrocystic breast    Fibromyalgia    Gastritis    GERD (gastroesophageal reflux disease)    Headache(784.0)    Heel spur    RIGHT   Hyperlipidemia    IBS (irritable bowel syndrome)    Lymphedema    right arm   Migraines    OSA (obstructive sleep apnea)    Current CPAP is not working   Panic attacks    RLS (restless legs syndrome)    Past Surgical History:  Procedure Laterality Date   ABDOMINAL HYSTERECTOMY  2005   BREAST LUMPECTOMY     Bilateral    KNEE SURGERY     LAPAROSCOPY  2005   MASTECTOMY     Right   OOPHORECTOMY     TONSILLECTOMY      Allergies  Allergies  Allergen Reactions   Milk (Cow) Diarrhea    Other reaction(s): Abdominal Pain, Abdominal Pain   Avelox [Moxifloxacin Hcl In Nacl]    Codeine    Diazepam    Hydrocodone    Hydrocodone-Acetaminophen    Hydrocodone-Acetaminophen    Levofloxacin    Levofloxacin    Moxifloxacin    Norethindrone    Other Other (See Comments)    INTESTINAL UPSET    Oxycodone Hcl    Penicillins    Prednisone Swelling   Ropinirole Hydrochloride    Sulfonamide Derivatives    Topiramate    Corn Oil Other (See Comments)    unknown Other reaction(s): Abdominal Pain   Fish Oil Other (See Comments)    INTESTINAL Other reaction(s): Abdominal Pain    History of Present Illness    Danielle Contreras is a PMH of anxiety, asthma, bipolar, breast cancer, depression, fibromyalgia,  gastritis, GERD, hyperlipidemia, IBS, lymphedema (right arm), migraines, OSA, and panic attacks.  She was seen by Dr. Percival Spanish on 03/13/2021.  During that time she reported that she had been started on Norvasc however her blood pressure went too low.  She stopped taking the medication.  She reported that since taking magnesium and potassium her blood pressure was much better controlled.  She was taking 1/4 pill of her metoprolol.  She reported feeling better than she had been.  She reported mild dizziness with standing up.  She had been cleaning out a trailer and organizing boxes.  She denied chest pain with the activity.  She denied shortness of breath orthopnea PND.  She has not gained weight or was noted to have lower extremity swelling.  She contacted the nurse triage line and reported increased heart rates, feeling dizzy, and shortness of breath on 12/05/2021.  She presents to the clinic today for evaluation and states over the last several weeks she has noticed increased palpitations.  She notices them both at rest and with increased physical activity.  She reports that she has a urine retention issue.  She does not feel that she  always hydrates appropriately.  She has been more active walking her son's dogs.  We reviewed her metoprolol dosing.  She reports that she has been taking a quarter of a pill and feels dizzy after she takes the medication.  I reviewed the importance of avoiding triggers for palpitations and she expressed understanding.  She had scheduled appointment with urology to address her urinary retention however she was unable to make the appointment.  I recommended that she reschedule her urology appointment.  We will request recent labs from her PCP, have her increase her p.o. hydration, continue to avoid triggers for palpitations, stop metoprolol and start propranolol.  We will plan follow-up after the cardiac event monitor.  Her EKG today shows normal sinus rhythm no ST or T wave  deviation 69 bpm  Today she denies chest pain, shortness of breath, lower extremity edema, fatigue, palpitations, melena, hematuria, hemoptysis, diaphoresis, weakness, presyncope, syncope, orthopnea, and PND.   Home Medications    Prior to Admission medications   Medication Sig Start Date End Date Taking? Authorizing Provider  albuterol (VENTOLIN HFA) 108 (90 Base) MCG/ACT inhaler  07/05/19   [provider]  amLODipine (NORVASC) 2.5 MG tablet Take 2.5 mg by mouth daily. 06/07/21   [provider]  aspirin 81 MG EC tablet Take by mouth.    [provider]  atorvastatin (LIPITOR) 20 MG tablet Take 20 mg by mouth daily. 08/09/21   [provider]  Calcium 500 MG tablet Calcium 500    [provider]  Calcium Carb-Cholecalciferol (CALCIUM PLUS VITAMIN D3 PO) Take 1 tablet by mouth daily.    [provider]  cyanocobalamin (,VITAMIN B-12,) 1000 MCG/ML injection Inject into the muscle. 07/30/21   [provider]  cyclobenzaprine (FLEXERIL) 10 MG tablet cyclobenzaprine 10 mg tablet    [provider]  dicyclomine (BENTYL) 20 MG tablet dicyclomine 20 mg tablet    [provider]  escitalopram (LEXAPRO) 10 MG tablet Take 10 mg by mouth daily. 01/23/21   [provider]  gabapentin (NEURONTIN) 600 MG tablet Take 600 mg by mouth 3 (three) times daily.    [provider]  magnesium oxide (MAG-OX) 400 (240 Mg) MG tablet Take 1 tablet by mouth daily. 05/01/21   [provider]  meclizine (ANTIVERT) 25 MG tablet meclizine 25 mg tablet  TAKE 1 TAB(S) ORALLY EVERY 6 HOURS    [provider]  metFORMIN (GLUCOPHAGE-XR) 500 MG 24 hr tablet Take 500 mg by mouth daily. 05/07/21   [provider]  metoprolol tartrate (LOPRESSOR) 25 MG tablet Take 12.5 mg by mouth as needed. 09/26/19   [provider]  montelukast (SINGULAIR) 10 MG tablet montelukast 10 mg tablet    [provider]  omeprazole (PRILOSEC) 40 MG capsule Take 40 mg by mouth daily.    [provider]  ondansetron (ZOFRAN) 4 MG tablet ondansetron HCl 4 mg tablet  TAKE 1 TAB(S) ORALLY EVERY 6 HOURS AS NEEDED FOR NAUSEA AND VOMITING.    [provider]  ondansetron (ZOFRAN-ODT) 4 MG disintegrating tablet ondansetron 4 mg disintegrating tablet  TAKE 1 TABLET BY MOUTH EVERY 6 HOURS AS NEEDED FOR NAUSEA/VOMITING    [provider]  potassium chloride SA (KLOR-CON) 20 MEQ tablet potassium chloride ER 20 mEq tablet,extended release(part/cryst)    [provider]  traMADol (ULTRAM) 50 MG tablet Take 50 mg by mouth every 6 (six) hours as needed.      [provider]  VASCEPA  1 g capsule Take 2 g by mouth 2 (two) times daily. 05/08/21   [provider]  ZTLIDO 1.8 % PTCH SMARTSIG:1 Patch(s) Topical As Needed 10/18/21   [provider]    Family History    Family History  Problem Relation Age of Onset   Coronary artery disease Brother        Vague history   Hypertension Brother    Stroke Maternal Grandmother    Heart disease Maternal Grandmother    Diabetes Mother        and Father    Breast cancer Mother    Hyperlipidemia Mother    Pancreatic cancer Mother    Diabetes Father    Hypertension Father    Hyperlipidemia Father    Colon cancer Neg Hx    She indicated that her mother is deceased. She indicated that her father is alive. She indicated that her brother is alive. She indicated that her maternal grandmother is deceased. She indicated that the status of her neg hx is unknown.  Social History    Social History   Socioeconomic History   Marital status: Married    Spouse name: Not on file   Number of children: 0   Years of education: 12   Highest education level: Not on file  Occupational History   Occupation: Disabled   Tobacco Use   Smoking status: Never   Smokeless tobacco: Never  Vaping Use   Vaping Use: Never used   Substance and Sexual Activity   Alcohol use: No   Drug use: No   Sexual activity: Not on file  Other Topics Concern   Not on file  Social History Narrative   Lives with husband and step son.     Caffeine use: daily   Right handed    Social Determinants of Health   Financial Resource Strain: Not on file  Food Insecurity: Not on file  Transportation Needs: Not on file  Physical Activity: Not on file  Stress: Not on file  Social Connections: Not on file  Intimate Partner Violence: Not on file     Review of Systems    General:  No chills, fever, night sweats or weight changes.  Cardiovascular:  No chest pain, dyspnea on exertion, edema, orthopnea, palpitations, paroxysmal nocturnal dyspnea. Dermatological: No rash, lesions/masses Respiratory: No cough, dyspnea Urologic: No hematuria, dysuria Abdominal:   No nausea, vomiting, diarrhea, bright red blood per rectum, melena, or hematemesis Neurologic:  No visual changes, wkns, changes in mental status. All other systems reviewed and are otherwise negative except as noted above.  Physical Exam    VS:  BP 128/81   Pulse 69   Ht 5' (1.524 m)   Wt 195 lb 9.6 oz (88.7 kg)   SpO2 98%   BMI 38.20 kg/m  , BMI Body mass index is 38.2 kg/m. GEN: Well nourished, well developed, in no acute distress. HEENT: normal. Neck: Supple, no JVD, carotid bruits, or masses. Cardiac: RRR, no murmurs, rubs, or gallops. No clubbing, cyanosis, edema.  Radials/DP/PT 2+ and equal bilaterally.  Respiratory:  Respirations regular and unlabored, clear to auscultation bilaterally. GI: Soft, nontender, nondistended, BS + x 4. MS: no deformity or atrophy. Skin: warm and dry, no rash. Neuro:  Strength and sensation are intact. Psych: Normal affect.  Accessory Clinical Findings    Recent Labs: 10/25/2021: ALT 14; BUN 13; Creatinine 0.6; Hemoglobin 11.9; Platelets 329; Potassium 4.0; Sodium 140   Recent Lipid Panel No results found for: "CHOL",  "  TRIG", "HDL", "CHOLHDL", "VLDL", "LDLCALC", "LDLDIRECT"  ECG personally reviewed by me today-normal sinus rhythm no ST or T wave deviation 69 bpm- No acute changes  Echocardiogram 10/16/2010 Study Conclusions   - Left ventricle: The cavity size was normal. Wall thickness was    normal. Systolic function was normal. The estimated ejection    fraction was in the range of 55% to 60%. Wall motion was normal;    there were no regional wall motion abnormalities. Left ventricular    diastolic function parameters were normal.  - Atrial septum: Cannot R/O PFO. No color flow done in subcostal    view  Assessment & Plan   1.  Palpitations-EKG today shows normal sinus rhythm 69 bpm no ST or T wave deviation.  Reports intermittent periods of palpitations over the past several days. Stop metoprolol Start propranolol 10 mg 3 times daily as needed-May initiate dosing 0.5 mg 3 times daily as needed and increase as long as blood pressure is greater than 800 systolic. Avoid triggers caffeine, chocolate, EtOH, dehydration etc. Increase p.o. hydration 7-day cardiac event monitor Request labs from [facility], BMP, magnesium  Shortness of breath, fatigue-notices increased work of breathing and fatigue in conjunction with her palpitations. Order cardiac event monitor  Essential hypertension-BP today 128/81.  Previously reported low blood pressures on Norvasc.  She self discontinued the medication.  She did note improvement in her blood pressure with the addition of magnesium and potassium. Continue metoprolol Heart healthy low-sodium diet-salty 6 given Increase physical activity as tolerated  Disposition: Follow-up with Dr. Percival Spanish or me and 2 months.   Jossie Ng. Jamicah Anstead NP-C     12/06/2021, 9:07 AM [address] Office [phone] Fax [phone]  Notice: This dictation was prepared with Dragon dictation along with smaller phrase technology. Any  transcriptional errors that result from this process are unintentional and may not be corrected upon review.  I spent 14 minutes examining this patient, reviewing medications, and using patient centered shared decision making involving her cardiac care.  Prior to her visit I spent greater than 20 minutes reviewing her past medical history,  medications, and prior cardiac tests.

## 2021-12-05 NOTE — Telephone Encounter (Signed)
Spoke to patient she stated she has been having fast heat beat for the past several days.She gets dizzy and sob.No chest pain.Appointment scheduled with Coletta Memos NP 7/28 at 9:15 am.

## 2021-12-06 ENCOUNTER — Encounter: Payer: Self-pay | Admitting: General Practice

## 2021-12-06 ENCOUNTER — Ambulatory Visit (INDEPENDENT_AMBULATORY_CARE_PROVIDER_SITE_OTHER): Payer: Medicare HMO

## 2021-12-06 ENCOUNTER — Ambulatory Visit: Payer: Medicare HMO | Admitting: General Practice

## 2021-12-06 VITALS — BP 128/81 | HR 69 | Ht 60.0 in | Wt 195.6 lb

## 2021-12-06 DIAGNOSIS — R002 Palpitations: Secondary | ICD-10-CM

## 2021-12-06 DIAGNOSIS — R5383 Other fatigue: Secondary | ICD-10-CM

## 2021-12-06 DIAGNOSIS — I10 Essential (primary) hypertension: Secondary | ICD-10-CM | POA: Diagnosis not present

## 2021-12-06 DIAGNOSIS — R0602 Shortness of breath: Secondary | ICD-10-CM | POA: Diagnosis not present

## 2021-12-06 MED ORDER — PROPRANOLOL HCL 10 MG PO TABS
10.0000 mg | ORAL_TABLET | Freq: Three times a day (TID) | ORAL | 3 refills | Status: DC
Start: 2021-12-06 — End: 2022-02-06

## 2021-12-06 NOTE — Progress Notes (Unsigned)
Registered ZIO XT 7 day to be mailed to pt's home address.

## 2021-12-06 NOTE — Patient Instructions (Signed)
Medication Instructions:  STOP METOPROLOL  START PROPRANOLOL '10MG'$  THREE TIMES DAILY-START WITH '5MG'$  AS NEEDED THEN INCREASE TO '10MG'$ , MAKE SURE THAT BLOOD PRESSURE SBP IS >110 (TOP NUMBER)  *If you need a refill on your cardiac medications before your next appointment, please call your pharmacy*  Lab Work:   Testing/Procedures:  NONE    NONE If you have labs (blood work) drawn today and your tests are completely normal, you will receive your results only by: MyChart Message (if you have MyChart) OR  A paper copy in the mail If you have any lab test that is abnormal or we need to change your treatment, we will call you to review the results.  Special Instructions Your physician has requested you wear a ZIO patch monitor for 7 days.   Please try to avoid these triggers: Do not use any products that have nicotine or tobacco in them. These include cigarettes, e-cigarettes, and chewing tobacco. If you need help quitting, ask your doctor. Eat heart-healthy foods. Talk with your doctor about the right eating plan for you. Exercise regularly as told by your doctor. Stay hydrated Do not drink alcohol, Caffeine or chocolate. Lose weight if you are overweight. Do not use drugs, including cannabis MAKE SURE TO WEAR YOUR CPAP     Follow-Up: Your next appointment:  8 week(s) In Person with Minus Breeding, MD  or Coletta Memos, FNP       At [facility], you and your health needs are our priority.  As part of our continuing mission to provide you with exceptional heart care, we have created designated Provider Care Teams.  These Care Teams include your primary Cardiologist (physician) and Advanced Practice Providers (APPs -  Physician Assistants and Nurse Practitioners) who all work together to provide you with the care you need, when you need it.  We recommend signing up for the patient portal called "MyChart".  Sign up information is provided on this After Visit Summary.  MyChart is used to  connect with patients for Virtual Visits (Telemedicine).  Patients are able to view lab/test results, encounter notes, upcoming appointments, etc.  Non-urgent messages can be sent to your provider as well.   To learn more about what you can do with MyChart, go to [email or web address].    Important Information About Sugar     ZIO XT- Long Term Monitor Instructions  Your physician has requested you wear a ZIO patch monitor for 7 days.  This is a single patch monitor. [employer] supplies one patch monitor per enrollment. Additional stickers are not available. Please do not apply patch if you will be having a Nuclear Stress Test,  Echocardiogram, Cardiac CT, MRI, or Chest Xray during the period you would be wearing the  monitor. The patch cannot be worn during these tests. You cannot remove and re-apply the  ZIO XT patch monitor.  Your ZIO patch monitor will be mailed 3 day USPS to your address on file. It may take 3-5 days  to receive your monitor after you have been enrolled.  Once you have received your monitor, please review the enclosed instructions. Your monitor  has already been registered assigning a specific monitor serial # to you.  Billing and Patient Assistance Program Information  We have supplied [employer] with any of your insurance information on file for billing purposes. [employer] offers a sliding scale Patient Assistance Program for patients that do not have  insurance, or whose insurance does not completely cover the cost of the ZIO monitor.  You must apply for the Patient Assistance Program to qualify for this discounted rate.  To apply, please call [employer] at [phone], select option 4, select option 2, ask to apply for  Patient Assistance Program. Theodore Demark will ask your household income, and how many people  are in your household. They will quote your out-of-pocket cost based on that information.  [employer] will also be able to set up a 67-month interest-free payment plan  if needed.  Applying the monitor   Shave hair from upper left chest.  Hold abrader disc by orange tab. Rub abrader in 40 strokes over the upper left chest as  indicated in your monitor instructions.  Clean area with 4 enclosed alcohol pads. Let dry.  Apply patch as indicated in monitor instructions. Patch will be placed under collarbone on left  side of chest with arrow pointing upward.  Rub patch adhesive wings for 2 minutes. Remove white label marked "1". Remove the white  label marked "2". Rub patch adhesive wings for 2 additional minutes.  While looking in a mirror, press and release button in center of patch. A small green light will  flash 3-4 times. This will be your only indicator that the monitor has been turned on.  Do not shower for the first 24 hours. You may shower after the first 24 hours.  Press the button if you feel a symptom. You will hear a small click. Record Date, Time and  Symptom in the Patient Logbook.  When you are ready to remove the patch, follow instructions on the last 2 pages of Patient  Logbook. Stick patch monitor onto the last page of Patient Logbook.  Place Patient Logbook in the blue and white box. Use locking tab on box and tape box closed  securely. The blue and white box has prepaid postage on it. Please place it in the mailbox as  soon as possible. Your physician should have your test results approximately 7 days after the  [facility]  Call [facility] [phone] you have questions regarding  your ZIO XT patch monitor. Call them immediately if you see an orange light blinking on your  monitor.  If your monitor falls off in less than 4 days, contact our Monitor department at [phone]  If your monitor becomes loose or falls off after 4 days call [employer] at [phone]  suggestions on securing your monitor

## 2021-12-17 DIAGNOSIS — R002 Palpitations: Secondary | ICD-10-CM | POA: Diagnosis not present

## 2022-01-08 ENCOUNTER — Ambulatory Visit: Payer: Medicare HMO | Admitting: Oncology

## 2022-01-17 ENCOUNTER — Encounter: Payer: Medicare HMO | Admitting: Genetic Counselor

## 2022-01-27 ENCOUNTER — Ambulatory Visit: Payer: Medicare HMO | Admitting: Cardiology

## 2022-02-02 NOTE — Progress Notes (Deleted)
[facility]  [address] [place],  [place]  [address] [phone]  Clinic Day:  02/02/2022  Referring physician: Rhea Bleacher, NP  ASSESSMENT & PLAN:   Assessment & Plan: Breast pain, left The patient has reported left breast pain in the past.  Left diagnostic mammogram and ultrasound in March revealed the small oval mass in the far left breast seen on screening mammogram in February 2 a normal intramammary lymph node.   There is persistent fullness in the left upper outer quadrant without discrete mass.  There is no palpable axillary adenopathy.  Her pain may simply be due to the weight of her breast.  Her lifetime risk of breast cancer is 20.9% using the IBIS calculation based on her family history.  Therefore, I recommend obtaining an MRI of the left breast for further evaluation.   Breast cancer, stage 3, right ([place]) Remote history of stage IIIA triple negative breast cancer diagnosed in July 2003. She was treated right modified mastectomy and adjuvant chemotherapy with Adriamycin/cyclophosphamide followed by paclitaxel.  She remains without evidence of recurrence.  She had testing for hereditary cancer syndromes in 2016, which was negative.  Testing has improved since that time, so I will have her see the genetic counselor again for her recommendations.       I provided 30 minutes of face-to-face time during this encounter and > 50% was spent counseling as documented under my assessment and plan.    Derwood Kaplan, MD  [facility] [address] [place] [address] Dept: [phone] Dept Fax: [phone]   No orders of the defined types were placed in this encounter.     CHIEF COMPLAINT:  CC: Left breast pain  Current Treatment: Evaluation  HISTORY OF PRESENT ILLNESS:  Danielle Contreras Danielle Contreras is a 51 year old female with a history of stage IIIA triple  negative breast cancer diagnosed at age 44 in July 2003. She was treated with a right modified radical mastectomy, followed by adjuvant chemotherapy with Adriamycin and cyclophosphamide for 3 months and Taxol for 3 months.  She has had a total abdominal hysterectomy and bilateral salpingo-oophorectomy for endometriosis.  Myriad myRisk Hereditary Cancer Panel in December 2016 did not reveal any clinically significant mutation or variants of uncertain significance.  Her mother had bilateral breast cancer at age 12 and age 49.  A maternal aunt also had breast cancer at an unknown age.  Her calculated lifetime risk based on the family history is 20.9%.   At her visit in 2016, there was concern over an area of thickening in the left breast, as well as increased pain.  She underwent a diagnostic left breast mammogram with ultrasound, as well as CT chest and bone scan.  There was no evidence of malignancy on mammogram and ultrasound the breast or CT chest.  There were degenerative changes on the bone scan, but no evidence of malignancy.  In February 2019, CT brain was negative.  Thyroid ultrasound at that time was negative for thyroid nodules.  In March, she had an ultrasound of the carotid arteries which revealed mild arteriosclerosis and less than 50% stenosis.  In June, she had an MRI cervical spine because of severe pain and numbness of the left arm.  This showed mild degenerative changes but no stenosis, although she did have small left-sided disc protrusion at C6-7.  She had a CT scan of abdomen/pelvis in June, which was equivocal  with some filling defects in the ascending colon and benign lipomatous infiltration of the ileocecal valve.  She has a small ventral hernia. There was a tiny lesion in the liver which appeared benign.  She has had imaging of multiple areas over the years.   Her mother was diagnosed with pancreatic cancer and expired in 2020.   CT abdomen/pelvis in January 2020 done for flank pain did  not reveal any acute abnormality. There was a stable sub-centimeter low-attenuation lesion of the liver, likely a tiny cyst.  No pancreatic mass was identified. CT abdomen/pelvis in June 2021 once again did not reveal any acute abnormality or pancreatic lesion. The liver cyst was stable. She reported episodes of rectal bleeding was scheduled for colonoscopy with Dr. Melina Copa, but she has never had this done due to cost.  She had a lichenoid keratosis removed from the right mastectomy site by Dr. Noberto Retort in 2021.  CT abdomen/pelvis in May 2022 did not reveal any acute abnormality or pancreatic lesion.  Annual left screening mammograms have not revealed any evidence of malignancy.   Left screening mammogram on February 2nd revealed a possible mass in the left breast.  Diagnostic mammogram and possibly ultrasound were recommended.  This came to our attention at her follow-up visit on February 27.  Left diagnostic mammogram and ultrasound revealed the recently suspected left breast mass to be a normal intramammary lymph node.  There was no evidence of malignancy.  INTERVAL HISTORY:  Danielle Contreras is added to the schedule today as she telephoned reporting left breast pain.  This is not a new symptom, but she reports worsening.  Left diagnostic mammogram in March did not reveal any evidence of malignancy.  She states she wears her mastectomy bra and right breast prosthesis. She denies fevers or chills. She denies pain. Her appetite is good. Her weight has increased 1 pounds over last 4 months .  Bone density scan in March was normal.  REVIEW OF SYSTEMS:  Review of Systems  Constitutional:  Negative for appetite change, chills, fatigue, fever and unexpected weight change.  HENT:   Negative for lump/mass, mouth sores and sore throat.   Respiratory:  Negative for cough and shortness of breath.   Cardiovascular:  Negative for chest pain and leg swelling.  Gastrointestinal:  Negative for abdominal pain, constipation,  diarrhea, nausea and vomiting.  Endocrine: Negative for hot flashes.  Genitourinary:  Negative for difficulty urinating, dysuria, frequency and hematuria.   Musculoskeletal:  Negative for arthralgias, back pain and myalgias.  Skin:  Negative for rash.  Neurological:  Negative for dizziness and headaches.  Hematological:  Negative for adenopathy. Does not bruise/bleed easily.  Psychiatric/Behavioral:  Negative for depression and sleep disturbance. The patient is not nervous/anxious.      VITALS:  There were no vitals taken for this visit.  Wt Readings from Last 3 Encounters:  12/06/21 195 lb 9.6 oz (88.7 kg)  10/25/21 195 lb 1.6 oz (88.5 kg)  07/08/21 194 lb 9.6 oz (88.3 kg)    There is no height or weight on file to calculate BMI.  Performance status (ECOG): 1 - Symptomatic but completely ambulatory  PHYSICAL EXAM:  Physical Exam Vitals and nursing note reviewed.  Constitutional:      General: She is not in acute distress.    Appearance: Normal appearance.  HENT:     Head: Normocephalic and atraumatic.     Mouth/Throat:     Mouth: Mucous membranes are moist.     Pharynx: Oropharynx is  clear. No oropharyngeal exudate or posterior oropharyngeal erythema.  Eyes:     General: No scleral icterus.    Extraocular Movements: Extraocular movements intact.     Conjunctiva/sclera: Conjunctivae normal.     Pupils: Pupils are equal, round, and reactive to light.  Cardiovascular:     Rate and Rhythm: Normal rate and regular rhythm.     Heart sounds: Normal heart sounds. No murmur heard.    No friction rub. No gallop.  Pulmonary:     Effort: Pulmonary effort is normal.     Breath sounds: Normal breath sounds. No wheezing, rhonchi or rales.  Chest:  Breasts:    Right: Absent.  Abdominal:     General: There is no distension.     Palpations: Abdomen is soft. There is no hepatomegaly, splenomegaly or mass.     Tenderness: There is no abdominal tenderness.  Musculoskeletal:         General: Normal range of motion.     Cervical back: Normal range of motion and neck supple. No tenderness.     Right lower leg: No edema.     Left lower leg: No edema.  Lymphadenopathy:     Cervical: No cervical adenopathy.     Upper Body:     Right upper body: No supraclavicular or axillary adenopathy.     Left upper body: No supraclavicular or axillary adenopathy.     Lower Body: No right inguinal adenopathy. No left inguinal adenopathy.  Skin:    General: Skin is warm and dry.     Coloration: Skin is not jaundiced.     Findings: No rash.  Neurological:     Mental Status: She is alert and oriented to person, place, and time.     Cranial Nerves: No cranial nerve deficit.  Psychiatric:        Mood and Affect: Mood normal.        Behavior: Behavior normal.        Thought Content: Thought content normal.    LABS:      Latest Ref Rng & Units 10/25/2021   12:00 AM 07/08/2021   12:00 AM 06/15/2020   12:00 AM  CBC  WBC  6.6     8.5     6.1      Hemoglobin 12.0 - 16.0 11.9     13.8     11.7      Hematocrit 36 - 46 37     40     34      Platelets 150 - 400 K/uL 329     383     320         This result is from an external source.       Latest Ref Rng & Units 10/25/2021   12:00 AM 07/08/2021   12:00 AM 06/15/2020   12:00 AM  CMP  BUN 4 - 21 13     12  12       Creatinine 0.5 - 1.1 0.6     0.9  0.7      Sodium 137 - 147 140     139  139      Potassium 3.5 - 5.1 mEq/L 4.0     3.6  3.5      Chloride 99 - 108 96     98  101      CO2 13 - 22 34     35  34      Calcium 8.7 - 10.7  9.7     9.3  9.2      Alkaline Phos 25 - 125 97     90  116      AST 13 - 35 29     35  34      ALT 7 - 35 U/L 14     19  17          This result is from an external source.      No results found for: "CEA1", "CEA" / No results found for: "CEA1", "CEA" No results found for: "PSA1" No results found for: "BTC481" No results found for: "CAN125"  No results found for: "TOTALPROTELP", "ALBUMINELP", "A1GS",  "A2GS", "BETS", "BETA2SER", "GAMS", "MSPIKE", "SPEI" Lab Results  Component Value Date   IRONPCTSAT 11.1 (L) 03/15/2008   No results found for: "LDH"  STUDIES:  No results found.    Exam(s): [id number] MAM/MAM DIGITAL W/TOMO DIAG L  ADDENDUM REPORT: 07/11/2021 13:53   ADDENDUM:  The 2nd impression should read as follows: No evidence of  malignancy.  Electronically Signed  By: Claudie Revering M.D.  On: 07/11/2021 13:53    CLINICAL DATA: Possible mass in the far posterior outer breast in  the oblique projection of a recent screening mammogram. Previous  right mastectomy for breast cancer   EXAM:  DIGITAL DIAGNOSTIC UNILATERAL LEFT MAMMOGRAM WITH TOMOSYNTHESIS AND  CAD; ULTRASOUND LEFT BREAST   TECHNIQUE:  Left digital diagnostic mammography and breast tomosynthesis was  performed. The images were evaluated with computer-aided detection.;  Targeted ultrasound examination of the left breast was performed.   COMPARISON: Previous exam(s).   ACR Breast Density Category b: There are scattered areas of  fibroglandular density.   FINDINGS:  The small, oval, circumscribed mass with a probable fatty notch in  the far posterior left breast could not be included on today's  images. No findings elsewhere in the breast suspicious for  malignancy.  Targeted ultrasound is performed, showing a 9 x 2 x 2 mm normal  intramammary lymph node in the 3 o'clock position of the left  breast, 18 cm from the nipple. This corresponds to the mammographic  mass.   IMPRESSION:  1. The recently suspected left breast mass is a normal intramammary  lymph node, not previously included due to its far posterior  position.  2. Evidence of malignancy.   RECOMMENDATION:  Left screening mammogram in February 2024 when due.   HISTORY:   Past Medical History:  Diagnosis Date   Anxiety    Asthma    Bipolar 1 disorder ([place])    Breast cancer ([facility])    Breast pain, left 10/25/2021   Depression     Fibrocystic breast    Fibromyalgia    Gastritis    GERD (gastroesophageal reflux disease)    Headache(784.0)    Heel spur    RIGHT   Hyperlipidemia    IBS (irritable bowel syndrome)    Lymphedema    right arm   Migraines    OSA (obstructive sleep apnea)    Current CPAP is not working   Panic attacks    RLS (restless legs syndrome)     Past Surgical History:  Procedure Laterality Date   ABDOMINAL HYSTERECTOMY  2005   BREAST LUMPECTOMY     Bilateral    KNEE SURGERY     LAPAROSCOPY  2005   MASTECTOMY     Right   OOPHORECTOMY     TONSILLECTOMY      Family History  Problem  Relation Age of Onset   Coronary artery disease Brother        Vague history   Hypertension Brother    Stroke Maternal Grandmother    Heart disease Maternal Grandmother    Diabetes Mother        and Father    Breast cancer Mother    Hyperlipidemia Mother    Pancreatic cancer Mother    Diabetes Father    Hypertension Father    Hyperlipidemia Father    Colon cancer Neg Hx     Social History:  reports that she has never smoked. She has never used smokeless tobacco. She reports that she does not drink alcohol and does not use drugs.The patient is alone today.  Allergies:  Allergies  Allergen Reactions   Milk (Cow) Diarrhea    Other reaction(s): Abdominal Pain, Abdominal Pain   Avelox [Moxifloxacin Hcl In Nacl]    Codeine    Diazepam    Hydrocodone    Hydrocodone-Acetaminophen    Hydrocodone-Acetaminophen    Levofloxacin    Levofloxacin    Moxifloxacin    Norethindrone    Other Other (See Comments)    INTESTINAL UPSET    Oxycodone Hcl    Penicillins    Prednisone Swelling   Ropinirole Hydrochloride    Sulfonamide Derivatives    Topiramate    Corn Oil Other (See Comments)    unknown Other reaction(s): Abdominal Pain   Fish Oil Other (See Comments)    INTESTINAL Other reaction(s): Abdominal Pain    Current Medications: Current Outpatient Medications  Medication Sig Dispense  Refill   albuterol (VENTOLIN HFA) 108 (90 Base) MCG/ACT inhaler      aspirin 81 MG EC tablet Take by mouth.     atorvastatin (LIPITOR) 20 MG tablet Take 20 mg by mouth daily.     Calcium 500 MG tablet Calcium 500     Calcium Carb-Cholecalciferol (CALCIUM PLUS VITAMIN D3 PO) Take 1 tablet by mouth daily.     cyanocobalamin (,VITAMIN B-12,) 1000 MCG/ML injection Inject into the muscle.     cyclobenzaprine (FLEXERIL) 10 MG tablet cyclobenzaprine 10 mg tablet     dicyclomine (BENTYL) 20 MG tablet dicyclomine 20 mg tablet     escitalopram (LEXAPRO) 10 MG tablet Take 10 mg by mouth daily. (Patient not taking: Reported on 12/06/2021)     gabapentin (NEURONTIN) 600 MG tablet Take 600 mg by mouth 3 (three) times daily.     magnesium oxide (MAG-OX) 400 (240 Mg) MG tablet Take 1 tablet by mouth daily.     meclizine (ANTIVERT) 25 MG tablet meclizine 25 mg tablet  TAKE 1 TAB(S) ORALLY EVERY 6 HOURS (Patient not taking: Reported on 12/06/2021)     metFORMIN (GLUCOPHAGE-XR) 500 MG 24 hr tablet Take 500 mg by mouth daily.     montelukast (SINGULAIR) 10 MG tablet montelukast 10 mg tablet (Patient not taking: Reported on 12/06/2021)     omeprazole (PRILOSEC) 40 MG capsule Take 40 mg by mouth daily.     ondansetron (ZOFRAN) 4 MG tablet ondansetron HCl 4 mg tablet  TAKE 1 TAB(S) ORALLY EVERY 6 HOURS AS NEEDED FOR NAUSEA AND VOMITING.     potassium chloride SA (KLOR-CON) 20 MEQ tablet potassium chloride ER 20 mEq tablet,extended release(part/cryst)     propranolol (INDERAL) 10 MG tablet Take 1 tablet (10 mg total) by mouth 3 (three) times daily. 90 tablet 3   traMADol (ULTRAM) 50 MG tablet Take 50 mg by mouth every 6 (six) hours as  needed.       VASCEPA 1 g capsule Take 2 g by mouth 2 (two) times daily. (Patient not taking: Reported on 12/06/2021)     ZTLIDO 1.8 % PTCH SMARTSIG:1 Patch(s) Topical As Needed (Patient not taking: Reported on 12/06/2021)     No current facility-administered medications for this visit.

## 2022-02-03 ENCOUNTER — Ambulatory Visit: Payer: Medicare HMO | Admitting: Oncology

## 2022-02-03 DIAGNOSIS — C50911 Malignant neoplasm of unspecified site of right female breast: Secondary | ICD-10-CM

## 2022-02-04 NOTE — Progress Notes (Unsigned)
Cardiology Office Note   Date:  02/06/2022   ID:  [address] [place], [place] 1970/11/05, MRN [id number]  PCP:  Rhea Bleacher, NP  Cardiologist:   Minus Breeding, MD   Chief Complaint  Patient presents with   Palpitations        History of Present Illness: Danielle Contreras is a 51 y.o. female  who presents for evaluation of HTN. I saw her in 2012 and 2016 for chest pain. She had a negative stress test in 2012.  She had difficult to control HTN.   I last saw her in August for follow up of hypertensive urgency.  She was started on Norvasc but she felt bad because her blood pressure went too low.  She stopped taking this.   She called in July with rapid heartbeats.  She had rare runs of SVT on the monitor.  These did not necessarily correlate with symptoms.  She returns for follow-up.  She says that her blood pressures been better controlled since she was taken off of metoprolol and put on propranolol 10 mg 3 times daily which happened at the last visit.  She is not getting the headaches that she was getting.  She says it still goes up and down a little bit.  She actually feels better when her systolic blood pressures above 140 but we discussed how this is not target.  She says she feels lightheaded when her diastolics are in the 98X or 53s.   Past Medical History:  Diagnosis Date   Anxiety    Asthma    Bipolar 1 disorder ([place])    Breast cancer ([place])    Breast pain, left 10/25/2021   Depression    Fibrocystic breast    Fibromyalgia    Gastritis    GERD (gastroesophageal reflux disease)    Headache(784.0)    Heel spur    RIGHT   Hyperlipidemia    IBS (irritable bowel syndrome)    Lymphedema    right arm   Migraines    OSA (obstructive sleep apnea)    Current CPAP is not working   Panic attacks    RLS (restless legs syndrome)     Past Surgical History:  Procedure Laterality Date   ABDOMINAL HYSTERECTOMY  2005   BREAST LUMPECTOMY     Bilateral     KNEE SURGERY     LAPAROSCOPY  2005   MASTECTOMY     Right   OOPHORECTOMY     TONSILLECTOMY       Current Outpatient Medications  Medication Sig Dispense Refill   albuterol (VENTOLIN HFA) 108 (90 Base) MCG/ACT inhaler      aspirin 81 MG EC tablet Take by mouth.     atorvastatin (LIPITOR) 40 MG tablet Take 40 mg by mouth daily.     Calcium 500 MG tablet Calcium 500     Calcium Carb-Cholecalciferol (CALCIUM PLUS VITAMIN D3 PO) Take 1 tablet by mouth daily.     cyanocobalamin (,VITAMIN B-12,) 1000 MCG/ML injection Inject into the muscle.     cyclobenzaprine (FLEXERIL) 10 MG tablet cyclobenzaprine 10 mg tablet     dicyclomine (BENTYL) 20 MG tablet dicyclomine 20 mg tablet     gabapentin (NEURONTIN) 600 MG tablet Take 600 mg by mouth 3 (three) times daily.     magnesium oxide (MAG-OX) 400 (240 Mg) MG tablet Take 1 tablet by mouth daily.     metFORMIN (GLUCOPHAGE-XR) 500 MG 24 hr tablet Take  500 mg by mouth daily.     montelukast (SINGULAIR) 10 MG tablet      omeprazole (PRILOSEC) 40 MG capsule Take 40 mg by mouth daily.     ondansetron (ZOFRAN) 4 MG tablet ondansetron HCl 4 mg tablet  TAKE 1 TAB(S) ORALLY EVERY 6 HOURS AS NEEDED FOR NAUSEA AND VOMITING.     potassium chloride SA (KLOR-CON) 20 MEQ tablet potassium chloride ER 20 mEq tablet,extended release(part/cryst)     propranolol (INDERAL) 10 MG tablet Take 10 mg by mouth 3 (three) times daily.     traMADol (ULTRAM) 50 MG tablet Take 50 mg by mouth every 6 (six) hours as needed.       VASCEPA 1 g capsule Take 2 g by mouth 2 (two) times daily.     ZTLIDO 1.8 % PTCH      No current facility-administered medications for this visit.    Allergies:   Milk (cow), Avelox [moxifloxacin hcl in nacl], Codeine, Diazepam, Hydrocodone, Hydrocodone-acetaminophen, Hydrocodone-acetaminophen, Levofloxacin, Levofloxacin, Moxifloxacin, Norethindrone, Other, Oxycodone hcl, Penicillins, Prednisone, Ropinirole hydrochloride, Sulfonamide derivatives,  Topiramate, Corn oil, and Fish oil   ROS:  Please see the history of present illness.   Otherwise, review of systems are positive for none.   All other systems are reviewed and negative.    PHYSICAL EXAM: VS:  BP 112/78   Pulse 70   Ht 5' (1.524 m)   Wt 192 lb 12.8 oz (87.5 kg)   SpO2 98%   BMI 37.65 kg/m  , BMI Body mass index is 37.65 kg/m. GENERAL:  Well appearing NECK:  No jugular venous distention, waveform within normal limits, carotid upstroke brisk and symmetric, no bruits, no thyromegaly LUNGS:  Clear to auscultation bilaterally CHEST:  Unremarkable HEART:  PMI not displaced or sustained,S1 and S2 within normal limits, no S3, no S4, no clicks, no rubs, no murmurs ABD:  Flat, positive bowel sounds normal in frequency in pitch, no bruits, no rebound, no guarding, no midline pulsatile mass, no hepatomegaly, no splenomegaly EXT:  2 plus pulses throughout, no edema, no cyanosis no clubbing   EKG:  EKG is  not ordered today.    Recent Labs: 10/25/2021: ALT 14; BUN 13; Creatinine 0.6; Hemoglobin 11.9; Platelets 329; Potassium 4.0; Sodium 140    Lipid Panel No results found for: "CHOL", "TRIG", "HDL", "CHOLHDL", "VLDL", "LDLCALC", "LDLDIRECT"    Wt Readings from Last 3 Encounters:  02/06/22 192 lb 12.8 oz (87.5 kg)  12/06/21 195 lb 9.6 oz (88.7 kg)  10/25/21 195 lb 1.6 oz (88.5 kg)      Other studies Reviewed: Additional studies/ records that were reviewed today include: Labs Review of the above records demonstrates:  Please see elsewhere in the note.     ASSESSMENT AND PLAN:  HTN:    Her blood pressure is well controlled on the meds as listed.  Talked about as needed dosing of her propranolol if her blood pressure goes up like she had with hypertensive urgency before.   FATIGUE:    She uses CPAP.  This does improve her fatigue.  No further work-up.  DIZZINESS: She has some mild orthostatic symptoms.    Current medicines are reviewed at length with the patient  today.  The patient does not have concerns regarding medicines.  The following changes have been made : None  Labs/ tests ordered today include:     None  No orders of the defined types were placed in this encounter.    Disposition:  FU with APP in one year. Ronnell Guadalajara, MD  02/06/2022 8:39 AM    [place]

## 2022-02-06 ENCOUNTER — Ambulatory Visit: Payer: Medicare HMO | Attending: Cardiology | Admitting: Cardiology

## 2022-02-06 ENCOUNTER — Encounter: Payer: Self-pay | Admitting: Oncology

## 2022-02-06 ENCOUNTER — Encounter: Payer: Self-pay | Admitting: Cardiology

## 2022-02-06 VITALS — BP 112/78 | HR 70 | Ht 60.0 in | Wt 192.8 lb

## 2022-02-06 DIAGNOSIS — R5383 Other fatigue: Secondary | ICD-10-CM

## 2022-02-06 DIAGNOSIS — I159 Secondary hypertension, unspecified: Secondary | ICD-10-CM

## 2022-02-06 DIAGNOSIS — R42 Dizziness and giddiness: Secondary | ICD-10-CM

## 2022-02-06 NOTE — Patient Instructions (Signed)
Medication Instructions:  NO CHANGES  *If you need a refill on your cardiac medications before your next appointment, please call your pharmacy*  Follow-Up: At [facility], you and your health needs are our priority.  As part of our continuing mission to provide you with exceptional heart care, we have created designated Provider Care Teams.  These Care Teams include your primary Cardiologist (physician) and Advanced Practice Providers (APPs -  Physician Assistants and Nurse Practitioners) who all work together to provide you with the care you need, when you need it.  We recommend signing up for the patient portal called "MyChart".  Sign up information is provided on this After Visit Summary.  MyChart is used to connect with patients for Virtual Visits (Telemedicine).  Patients are able to view lab/test results, encounter notes, upcoming appointments, etc.  Non-urgent messages can be sent to your provider as well.   To learn more about what you can do with MyChart, go to [email or web address].    Your next appointment:    12 months with Dr. Hochrein 

## 2022-03-03 ENCOUNTER — Other Ambulatory Visit: Payer: Self-pay | Admitting: General Practice

## 2022-03-25 ENCOUNTER — Other Ambulatory Visit: Payer: Self-pay

## 2022-03-25 MED ORDER — PROPRANOLOL HCL 10 MG PO TABS: 10.0000 mg | ORAL_TABLET | Freq: Three times a day (TID) | ORAL | 3 refills | Status: DC

## 2022-03-26 ENCOUNTER — Other Ambulatory Visit: Payer: Self-pay

## 2022-03-26 MED ORDER — PROPRANOLOL HCL 10 MG PO TABS: 10.0000 mg | ORAL_TABLET | Freq: Three times a day (TID) | ORAL | 3 refills | Status: DC

## 2022-05-16 ENCOUNTER — Ambulatory Visit: Payer: Medicare HMO

## 2022-06-03 NOTE — Therapy (Deleted)
OUTPATIENT PHYSICAL THERAPY FEMALE PELVIC EVALUATION   Patient Name: Danielle Contreras MRN: [id number] DOB:13-Jan-1971, 52 y.o., female Today's Date: 06/03/2022  END OF SESSION:   Past Medical History:  Diagnosis Date   Anxiety    Asthma    Bipolar 1 disorder ([place])    Breast cancer ([place])    Breast pain, left 10/25/2021   Depression    Fibrocystic breast    Fibromyalgia    Gastritis    GERD (gastroesophageal reflux disease)    Headache(784.0)    Heel spur    RIGHT   Hyperlipidemia    IBS (irritable bowel syndrome)    Lymphedema    right arm   Migraines    OSA (obstructive sleep apnea)    Current CPAP is not working   Panic attacks    RLS (restless legs syndrome)    Past Surgical History:  Procedure Laterality Date   ABDOMINAL HYSTERECTOMY  2005   BREAST LUMPECTOMY     Bilateral    KNEE SURGERY     LAPAROSCOPY  2005   MASTECTOMY     Right   OOPHORECTOMY     TONSILLECTOMY     Patient Active Problem List   Diagnosis Date Noted   Breast pain, left 10/25/2021   Abnormality of left breast on screening mammogram 07/08/2021   Hypertensive urgency 01/06/2021   Fatigue 12/20/2020   Essential hypertension 04/05/2015   Cough 03/20/2015   Dyspnea 03/20/2015   Morbid obesity ([place]) 01/14/2011   Constipation 11/26/2010   Palpitations 10/16/2010   IBS 03/20/2008   DYSPHAGIA UNSPECIFIED 03/20/2008   HYPERLIPIDEMIA 03/15/2008   BIPOLAR DISORDER UNSPECIFIED 03/15/2008   PANIC ATTACK 03/15/2008   DEPRESSION 03/15/2008   MIGRAINE HEADACHE 03/15/2008   LYMPHEDEMA, RIGHT ARM 03/15/2008   ASTHMA 03/15/2008   GERD 03/15/2008   UTI 03/15/2008   HEEL SPUR 03/15/2008   PLANTAR FASCIITIS, RIGHT 03/15/2008   SLEEP APNEA 03/15/2008   CHEST PAIN 03/15/2008   ANXIETY DISORDER, HX OF 03/15/2008   RESTLESS LEG SYNDROME, HX OF 03/15/2008   HEART MURMUR, HX OF 03/15/2008   IRRITABLE BOWEL SYNDROME, HX OF 03/15/2008   FIBROCYSTIC BREAST DISEASE, HX OF 03/15/2008    Breast cancer, stage 3, right ([place]) 11/19/2001    Class: History of    PCP: Rhea Bleacher, NP  REFERRING PROVIDER: Robley Fries, MD   REFERRING DIAG: Urinary frequency, incomplete emptying   THERAPY DIAG:  No diagnosis found.  Rationale for Evaluation and Treatment: Rehabilitation  ONSET DATE: ***  SUBJECTIVE:  SUBJECTIVE STATEMENT: *** Fluid intake: {Yes/No:304960894}   PAIN:  Are you having pain? {yes/no:20286} NPRS scale: ***/10 Pain location: {pelvic pain location:27098}  Pain type: {type:313116} Pain description: {PAIN DESCRIPTION:21022940}   Aggravating factors: *** Relieving factors: ***  PRECAUTIONS: {Therapy precautions:24002}  WEIGHT BEARING RESTRICTIONS: {Yes ***/No:24003}  FALLS:  Has patient fallen in last 6 months? {fallsyesno:27318}  LIVING ENVIRONMENT: Lives with: {[facility] lives with:25569::"lives with their family"} Lives in: {Lives in:25570} Stairs: {opstairs:27293} Has following equipment at home: {Assistive devices:23999}  OCCUPATION: ***  PLOF: {PLOF:24004}  PATIENT GOALS: ***  PERTINENT HISTORY:  Abdominal Hysterectomy; Mastectomy right; Breast cancer; Bipolar; fibromyalgia; IBS; Lymphedema right arm Sexual abuse: {Yes/No:304960894}  BOWEL MOVEMENT: Pain with bowel movement: {yes/no:20286} Type of bowel movement:{PT BM type:27100} Fully empty rectum: {Yes/No:304960894} Leakage: {Yes/No:304960894} Pads: {Yes/No:304960894} Fiber supplement: {Yes/No:304960894}  URINATION: Pain with urination: {yes/no:20286} Fully empty bladder: {Yes/No:304960894} Stream: {PT urination:27102} Urgency: {Yes/No:304960894} Frequency: *** Leakage: {PT leakage:27103} Pads: {Yes/No:304960894}  INTERCOURSE: Pain with intercourse: {pain with intercourse  PA:27099} Ability to have vaginal penetration:  {Yes/No:304960894} Climax: *** Marinoff Scale: ***/3  PREGNANCY: Vaginal deliveries *** Tearing {Yes***/No:304960894} C-section deliveries *** Currently pregnant {Yes***/No:304960894}  PROLAPSE: {PT prolapse:27101}   OBJECTIVE:   DIAGNOSTIC FINDINGS:  ***  PATIENT SURVEYS:  {rehab surveys:24030}  PFIQ-7 ***  COGNITION: Overall cognitive status: {cognition:24006}     SENSATION: Light touch: {intact/deficits:24005} Proprioception: {intact/deficits:24005}  MUSCLE LENGTH: Hamstrings: Right *** deg; Left *** deg Thomas test: Right *** deg; Left *** deg  LUMBAR SPECIAL TESTS:  {lumbar special test:25242}  FUNCTIONAL TESTS:  {Functional tests:24029}  GAIT: Distance walked: *** Assistive device utilized: {Assistive devices:23999} Level of assistance: {Levels of assistance:24026} Comments: ***  POSTURE: {posture:25561}  PELVIC ALIGNMENT:  LUMBARAROM/PROM:  A/PROM A/PROM  eval  Flexion   Extension   Right lateral flexion   Left lateral flexion   Right rotation   Left rotation    (Blank rows = not tested)  LOWER EXTREMITY ROM:  {AROM/PROM:27142} ROM Right eval Left eval  Hip flexion    Hip extension    Hip abduction    Hip adduction    Hip internal rotation    Hip external rotation    Knee flexion    Knee extension    Ankle dorsiflexion    Ankle plantarflexion    Ankle inversion    Ankle eversion     (Blank rows = not tested)  LOWER EXTREMITY MMT:  MMT Right eval Left eval  Hip flexion    Hip extension    Hip abduction    Hip adduction    Hip internal rotation    Hip external rotation    Knee flexion    Knee extension    Ankle dorsiflexion    Ankle plantarflexion    Ankle inversion    Ankle eversion     PALPATION:   General  ***                External Perineal Exam ***                             Internal Pelvic Floor ***  Patient confirms identification and approves PT to  assess internal pelvic floor and treatment {yes/no:20286}  PELVIC MMT:   MMT eval  Vaginal   Internal Anal Sphincter   External Anal Sphincter   Puborectalis   Diastasis Recti   (Blank rows = not tested)        TONE: ***  PROLAPSE: ***  TODAY'S TREATMENT:                                                                                                                              DATE: ***  EVAL ***   PATIENT EDUCATION:  Education details: *** Person educated: {Person educated:25204} Education method: {Education Method:25205} Education comprehension: {Education Comprehension:25206}  HOME EXERCISE PROGRAM: ***  ASSESSMENT:  CLINICAL IMPRESSION: Patient is a *** y.o. *** who was seen today for physical therapy evaluation and treatment for ***.   OBJECTIVE IMPAIRMENTS: {opptimpairments:25111}.   ACTIVITY LIMITATIONS: {activitylimitations:27494}  PARTICIPATION LIMITATIONS: {participationrestrictions:25113}  PERSONAL FACTORS: {Personal factors:25162} are also affecting patient's functional outcome.   REHAB POTENTIAL: {rehabpotential:25112}  CLINICAL DECISION MAKING: {clinical decision making:25114}  EVALUATION COMPLEXITY: {Evaluation complexity:25115}   GOALS: Goals reviewed with patient? {yes/no:20286}  SHORT TERM GOALS: Target date: ***  *** Baseline: Goal status: {GOALSTATUS:25110}  2.  *** Baseline:  Goal status: {GOALSTATUS:25110}  3.  *** Baseline:  Goal status: {GOALSTATUS:25110}  4.  *** Baseline:  Goal status: {GOALSTATUS:25110}  5.  *** Baseline:  Goal status: {GOALSTATUS:25110}  6.  *** Baseline:  Goal status: {GOALSTATUS:25110}  LONG TERM GOALS: Target date: ***  *** Baseline:  Goal status: {GOALSTATUS:25110}  2.  *** Baseline:  Goal status: {GOALSTATUS:25110}  3.  *** Baseline:  Goal status: {GOALSTATUS:25110}  4.  *** Baseline:  Goal status: {GOALSTATUS:25110}  5.  *** Baseline:  Goal status:  {GOALSTATUS:25110}  6.  *** Baseline:  Goal status: {GOALSTATUS:25110}  PLAN:  PT FREQUENCY: {rehab frequency:25116}  PT DURATION: {rehab duration:25117}  PLANNED INTERVENTIONS: {rehab planned interventions:25118::"Therapeutic exercises","Therapeutic activity","Neuromuscular re-education","Balance training","Gait training","Patient/Family education","Self Care","Joint mobilization"}  PLAN FOR NEXT SESSION: ***   Cambrey Lupi, PT 06/03/2022, 12:52 PM

## 2022-06-04 ENCOUNTER — Ambulatory Visit: Payer: Medicare HMO | Admitting: Physical Therapy

## 2022-07-03 NOTE — Progress Notes (Shared)
[facility]  [address] [place],  [place]  [address] [phone]  Clinic Day:  07/03/2022  Referring physician: Rhea Bleacher, NP    CHIEF COMPLAINT:  CC: History of stage IIIA triple negative breast cancer  Current Treatment:  Surveillance   HISTORY OF PRESENT ILLNESS:  Danielle Contreras is a 52 y.o. female with a history of stage IIIA triple negative breast cancer diagnosed at age 59 in July 2003. She was treated with a right modified radical mastectomy, followed by adjuvant chemotherapy with Adriamycin and cyclophosphamide for 3 months and Taxol for 3 months.  She has had a total abdominal hysterectomy and bilateral salpingo-oophorectomy for endometriosis.  At her visit in 2016, there was concern over an area of thickening in the left breast, as well as increased pain.  She underwent a diagnostic left breast mammogram with ultrasound, as well as CT chest and bone scan.  There was no evidence of malignancy on mammogram and ultrasound the breast or CT chest.  There were degenerative changes on the bone scan, but no evidence of malignancy.  Her mother had bilateral breast cancer at 63 and then at 47, a maternal aunt had breast cancer at uncertain age, a maternal niece had cervical cancer at an uncertain age, and a maternal uncle died of an uncertain malignancy at an uncertain age.  She did have genetic testing and this was negative.  She had a CT of the brain in February 2019 which was negative, and a thyroid ultrasound which was negative for thyroid nodules.  In March she had an ultrasound of the carotid arteries which revealed mild arteriosclerosis and less than 50% stenosis.  In June, she had an MRI of the cervical spine because of severe pain and numbness of the left arm.  This showed mild degenerative changes but no stenosis, although she did have small left-sided disc protrusion at C6-7.  She also had a CT scan of abdomen and pelvis in June  which was equivocal with some filling defects in the ascending colon and benign lipomatous infiltration of the ileocecal valve.  She has a small ventral hernia. There was a tiny cyst in the liver which appears benign. Her mother was diagnosed with pancreatic cancer and expired in 2020.  She had colonoscopy in 2020 with Dr. Melina Copa, and has chronic soft stool or diarrhea.  INTERVAL HISTORY:  Danielle Contreras is here for annual follow up and states that she has been well.        Annual unilateral left mammogram from February 1st was was clear.  She had a lichenoid keratosis removed from the right mastectomy site by Dr. Noberto Retort last year.    She has had episodes of rectal bleeding and states that she was scheduled for colonoscopy with Dr. Melina Copa, but was told she would have to pay up front, which she has never had to do.  Therefore, this evaluation has not been done and will be rescheduled.    Hemoglobin has mildly decreased from 12.5 to 11.7, and her white count and platelets are normal.  Chemistries are unremarkable.    Her  appetite is good, and she has gained 20 pounds since her last visit.  She denies fever, chills or other signs of infection.  She denies nausea, vomiting, bowel issues, or abdominal pain.  She denies sore throat, cough, dyspnea, or chest pain.  REVIEW OF SYSTEMS:  Review of Systems  Constitutional: Negative.   HENT:  Negative.  Eyes: Negative.   Respiratory: Negative.    Cardiovascular: Negative.   Gastrointestinal:        Rectal bleeding  Endocrine: Negative.   Genitourinary: Negative.    Musculoskeletal: Negative.   Skin: Negative.   Neurological: Negative.   Hematological: Negative.   Psychiatric/Behavioral: Negative.       VITALS:  There were no vitals taken for this visit.  Wt Readings from Last 3 Encounters:  02/06/22 192 lb 12.8 oz (87.5 kg)  12/06/21 195 lb 9.6 oz (88.7 kg)  10/25/21 195 lb 1.6 oz (88.5 kg)    There is no height or weight on file to  calculate BMI.  Performance status (ECOG): 1 - Symptomatic but completely ambulatory  PHYSICAL EXAM:  Physical Exam Constitutional:      General: She is not in acute distress.    Appearance: Normal appearance. She is normal weight.  HENT:     Head: Normocephalic and atraumatic.  Eyes:     General: No scleral icterus.    Extraocular Movements: Extraocular movements intact.     Conjunctiva/sclera: Conjunctivae normal.     Pupils: Pupils are equal, round, and reactive to light.  Cardiovascular:     Rate and Rhythm: Normal rate and regular rhythm.     Pulses: Normal pulses.     Heart sounds: Normal heart sounds. No murmur heard.    No friction rub. No gallop.  Pulmonary:     Effort: Pulmonary effort is normal. No respiratory distress.     Breath sounds: Normal breath sounds.  Chest:     Comments: Mild pink keratotic scar in the mid right mastectomy which is healing fine.  Left breast has scattered fibrocystic changes, but nothing suspicious. Abdominal:     General: Bowel sounds are normal. There is no distension.     Palpations: Abdomen is soft. There is no mass.     Tenderness: There is no abdominal tenderness.  Musculoskeletal:        General: Normal range of motion.     Cervical back: Normal range of motion and neck supple.     Right lower leg: No edema.     Left lower leg: No edema.  Lymphadenopathy:     Cervical: No cervical adenopathy.  Skin:    General: Skin is warm and dry.  Neurological:     General: No focal deficit present.     Mental Status: She is alert and oriented to person, place, and time. Mental status is at baseline.  Psychiatric:        Mood and Affect: Mood normal.        Behavior: Behavior normal.        Thought Content: Thought content normal.        Judgment: Judgment normal.     LABS:      Latest Ref Rng & Units 10/25/2021   12:00 AM 07/08/2021   12:00 AM 06/15/2020   12:00 AM  CBC  WBC  6.6     8.5     6.1      Hemoglobin 12.0 - 16.0 11.9      13.8     11.7      Hematocrit 36 - 46 37     40     34      Platelets 150 - 400 K/uL 329     383     320         This result is from an external source.  Latest Ref Rng & Units 10/25/2021   12:00 AM 07/08/2021   12:00 AM 06/15/2020   12:00 AM  CMP  BUN 4 - 21 13     12  12      $ Creatinine 0.5 - 1.1 0.6     0.9  0.7      Sodium 137 - 147 140     139  139      Potassium 3.5 - 5.1 mEq/L 4.0     3.6  3.5      Chloride 99 - 108 96     98  101      CO2 13 - 22 34     35  34      Calcium 8.7 - 10.7 9.7     9.3  9.2      Alkaline Phos 25 - 125 97     90  116      AST 13 - 35 29     35  34      ALT 7 - 35 U/L 14     19  17         $ This result is from an external source.      No results found for: "CEA1", "CEA" / No results found for: "CEA1", "CEA" No results found for: "PSA1" No results found for: "EV:6189061" No results found for: "CAN125"  No results found for: "TOTALPROTELP", "ALBUMINELP", "A1GS", "A2GS", "BETS", "BETA2SER", "GAMS", "MSPIKE", "SPEI" Lab Results  Component Value Date   IRONPCTSAT 11.1 (L) 03/15/2008   No results found for: "LDH"   STUDIES:   Annual mammogram from 2020/06/17 revealed: breast density category B.  No mammographic evidence of malignancy.  Allergies:  Allergies  Allergen Reactions   Milk (Cow) Diarrhea    Other reaction(s): Abdominal Pain, Abdominal Pain   Avelox [Moxifloxacin Hcl In Nacl]    Codeine    Diazepam    Hydrocodone    Hydrocodone-Acetaminophen    Hydrocodone-Acetaminophen    Levofloxacin    Levofloxacin    Moxifloxacin    Norethindrone    Other Other (See Comments)    INTESTINAL UPSET    Oxycodone Hcl    Penicillins    Prednisone Swelling   Ropinirole Hydrochloride    Sulfonamide Derivatives    Topiramate    Corn Oil Other (See Comments)    unknown Other reaction(s): Abdominal Pain   Fish Oil Other (See Comments)    INTESTINAL Other reaction(s): Abdominal Pain    Current Medications: Current Outpatient  Medications  Medication Sig Dispense Refill   albuterol (VENTOLIN HFA) 108 (90 Base) MCG/ACT inhaler      aspirin 81 MG EC tablet Take by mouth.     atorvastatin (LIPITOR) 40 MG tablet Take 40 mg by mouth daily.     Calcium 500 MG tablet Calcium 500     Calcium Carb-Cholecalciferol (CALCIUM PLUS VITAMIN D3 PO) Take 1 tablet by mouth daily.     cyanocobalamin (,VITAMIN B-12,) 1000 MCG/ML injection Inject into the muscle.     cyclobenzaprine (FLEXERIL) 10 MG tablet cyclobenzaprine 10 mg tablet     dicyclomine (BENTYL) 20 MG tablet dicyclomine 20 mg tablet     gabapentin (NEURONTIN) 600 MG tablet Take 600 mg by mouth 3 (three) times daily.     magnesium oxide (MAG-OX) 400 (240 Mg) MG tablet Take 1 tablet by mouth daily.     metFORMIN (GLUCOPHAGE-XR) 500 MG 24 hr tablet Take 500 mg by mouth daily.  montelukast (SINGULAIR) 10 MG tablet      omeprazole (PRILOSEC) 40 MG capsule Take 40 mg by mouth daily.     ondansetron (ZOFRAN) 4 MG tablet ondansetron HCl 4 mg tablet  TAKE 1 TAB(S) ORALLY EVERY 6 HOURS AS NEEDED FOR NAUSEA AND VOMITING.     potassium chloride SA (KLOR-CON) 20 MEQ tablet potassium chloride ER 20 mEq tablet,extended release(part/cryst)     propranolol (INDERAL) 10 MG tablet Take 1 tablet (10 mg total) by mouth 3 (three) times daily. 270 tablet 3   traMADol (ULTRAM) 50 MG tablet Take 50 mg by mouth every 6 (six) hours as needed.       VASCEPA 1 g capsule Take 2 g by mouth 2 (two) times daily.     ZTLIDO 1.8 % PTCH      No current facility-administered medications for this visit.     ASSESSMENT & PLAN:   Assessment:   1.  Remote history of breast cancer, diagnosed in July 2003.  She remains without evidence of disease.  2.  Hypokalemia, resolved on oral supplement 20 meq daily.  3.  Lichenoid keratosis of the right mastectomy incision, excised by Dr. Noberto Retort.  4.  Rectal bleeding.  I encouraged her to follow up with the colonoscopy when she is able to do so.  Plan: I  can see her back in 1 year with CBC and comprehensive metabolic profile and left mammogram.  She understands and agrees with this plan of care.   I provided 20 minutes of face-to-face time during this this encounter and > 50% was spent counseling as documented under my assessment and plan.    Derwood Kaplan, MD [facility] [address] [place] [address] Dept: [phone] Dept Fax: [phone]    Ninetta Lights as a scribe for Derwood Kaplan, MD.,have documented all relevant documentation on the behalf of Derwood Kaplan, MD,as directed by  Derwood Kaplan, MD while in the presence of Derwood Kaplan, MD.

## 2022-07-07 ENCOUNTER — Telehealth: Payer: Self-pay | Admitting: Oncology

## 2022-07-07 ENCOUNTER — Ambulatory Visit: Payer: Medicare HMO

## 2022-07-07 NOTE — Telephone Encounter (Signed)
07/07/22 Next appt scheduled and confirmed with patient

## 2022-07-08 ENCOUNTER — Encounter: Payer: Self-pay | Admitting: Oncology

## 2022-07-08 ENCOUNTER — Other Ambulatory Visit: Payer: Medicare HMO

## 2022-07-08 ENCOUNTER — Ambulatory Visit: Payer: Medicare HMO | Admitting: Oncology

## 2022-07-21 ENCOUNTER — Other Ambulatory Visit: Payer: Self-pay | Admitting: Oncology

## 2022-07-21 DIAGNOSIS — C50911 Malignant neoplasm of unspecified site of right female breast: Secondary | ICD-10-CM

## 2022-07-22 ENCOUNTER — Inpatient Hospital Stay: Payer: Medicare HMO | Attending: Oncology

## 2022-07-22 ENCOUNTER — Encounter: Payer: Self-pay | Admitting: Oncology

## 2022-07-22 ENCOUNTER — Inpatient Hospital Stay: Payer: Medicare HMO | Admitting: Oncology

## 2022-07-22 ENCOUNTER — Other Ambulatory Visit: Payer: Self-pay | Admitting: Oncology

## 2022-07-22 ENCOUNTER — Telehealth: Payer: Self-pay | Admitting: Oncology

## 2022-07-22 VITALS — BP 139/73 | HR 71 | Temp 98.4°F | Resp 18 | Ht 60.0 in | Wt 193.0 lb

## 2022-07-22 DIAGNOSIS — C50911 Malignant neoplasm of unspecified site of right female breast: Secondary | ICD-10-CM

## 2022-07-22 DIAGNOSIS — Z853 Personal history of malignant neoplasm of breast: Secondary | ICD-10-CM | POA: Diagnosis not present

## 2022-07-22 LAB — CMP (CANCER CENTER ONLY)
ALT: 14 U/L (ref 0–44)
AST: 22 U/L (ref 15–41)
Albumin: 4 g/dL (ref 3.5–5.0)
Alkaline Phosphatase: 103 U/L (ref 38–126)
Anion gap: 14 (ref 5–15)
BUN: 10 mg/dL (ref 6–20)
CO2: 30 mmol/L (ref 22–32)
Calcium: 9.2 mg/dL (ref 8.9–10.3)
Chloride: 97 mmol/L — ABNORMAL LOW (ref 98–111)
Creatinine: 0.93 mg/dL (ref 0.44–1.00)
GFR, Estimated: >60 mL/min (ref >60)
Glucose, Bld: 98 mg/dL (ref 70–99)
Potassium: 3.2 mmol/L — ABNORMAL LOW (ref 3.5–5.1)
Sodium: 141 mmol/L (ref 135–145)
Total Bilirubin: 0.4 mg/dL (ref 0.3–1.2)
Total Protein: 7.5 g/dL (ref 6.5–8.1)

## 2022-07-22 LAB — CBC WITH DIFFERENTIAL (CANCER CENTER ONLY)
Abs Immature Granulocytes: 0.02 10*3/uL (ref 0.00–0.07)
Basophils Absolute: 0.1 10*3/uL (ref 0.0–0.1)
Basophils Relative: 1 %
Eosinophils Absolute: 0.3 10*3/uL (ref 0.0–0.5)
Eosinophils Relative: 3 %
HCT: 36.6 % (ref 36.0–46.0)
Hemoglobin: 11.7 g/dL — ABNORMAL LOW (ref 12.0–15.0)
Immature Granulocytes: 0 %
Lymphocytes Relative: 38 %
Lymphs Abs: 3.7 10*3/uL (ref 0.7–4.0)
MCH: 29.9 pg (ref 26.0–34.0)
MCHC: 32 g/dL (ref 30.0–36.0)
MCV: 93.6 fL (ref 80.0–100.0)
Monocytes Absolute: 0.7 10*3/uL (ref 0.1–1.0)
Monocytes Relative: 7 %
Neutro Abs: 4.9 10*3/uL (ref 1.7–7.7)
Neutrophils Relative %: 51 %
Platelet Count: 344 10*3/uL (ref 150–400)
RBC: 3.91 MIL/uL (ref 3.87–5.11)
RDW: 12.5 % (ref 11.5–15.5)
WBC Count: 9.7 10*3/uL (ref 4.0–10.5)
nRBC: 0 % (ref 0.0–0.2)

## 2022-07-22 NOTE — Progress Notes (Signed)
[facility]  [address] [place],  [place]  [address] [phone]  Clinic Day: 07/22/22  Referring physician: Erskine Emery, NP    CHIEF COMPLAINT:  CC: History of stage IIIA triple negative breast cancer  Current Treatment:  Surveillance   HISTORY OF PRESENT ILLNESS:  Danielle Contreras is a 52 y.o. female with a history of stage IIIA triple negative breast cancer diagnosed at age 64 in July 2003. She was treated with a right modified radical mastectomy, followed by adjuvant chemotherapy with Adriamycin and cyclophosphamide for 3 months and Taxol for 3 months.  She has had a total abdominal hysterectomy and bilateral salpingo-oophorectomy for endometriosis.  At her visit in 2016, there was concern over an area of thickening in the left breast, as well as increased pain.  She underwent a diagnostic left breast mammogram with ultrasound, as well as CT chest and bone scan.  There was no evidence of malignancy on mammogram and ultrasound the breast or CT chest.  There were degenerative changes on the bone scan, but no evidence of malignancy.  Her mother had bilateral breast cancer at 74 and then at 57, a maternal aunt had breast cancer at uncertain age, a maternal niece had cervical cancer at an uncertain age, and a maternal uncle died of an uncertain malignancy at an uncertain age.  She did have genetic testing and this was negative.  She had a CT of the brain in February 2019 which was negative, and a thyroid ultrasound which was negative for thyroid nodules.  In March she had an ultrasound of the carotid arteries which revealed mild arteriosclerosis and less than 50% stenosis.  In June, she had an MRI of the cervical spine because of severe pain and numbness of the left arm.  This showed mild degenerative changes but no stenosis, although she did have small left-sided disc protrusion at C6-7.  She also had a CT scan of abdomen and pelvis in June which  was equivocal with some filling defects in the ascending colon and benign lipomatous infiltration of the ileocecal valve.  She has a small ventral hernia. There was a tiny cyst in the liver which appears benign. Her mother was diagnosed with pancreatic cancer and expired in 2020.  She had colonoscopy in 2020 with Dr. Charm Barges, and has chronic soft stool or diarrhea.  INTERVAL HISTORY:  Danielle Contreras is here for annual follow up and states that she has been well.  Annual unilateral left mammogram from February 16th was was clear.  She notes that she has been feeling very tender underneath her arm and under her breast. I have suggested she take Vitamin E 400 IU, once a day  to help treat with this as she has extensive fibrocystic disease and is already restricting her caffeine. She did have a colonoscopy, done by Dr.Butler.  Her  appetite is good, and she has gained 1 pound since her last visit.  She denies fever, chills or other signs of infection.  She denies nausea, vomiting, bowel issues, or abdominal pain.  She denies sore throat, cough, dyspnea, or chest pain.  Today's labs are pending.  REVIEW OF SYSTEMS:  Review of Systems  Constitutional: Negative.   HENT:  Negative.    Eyes: Negative.   Respiratory: Negative.    Cardiovascular: Negative.   Gastrointestinal:        Rectal bleeding  Endocrine: Negative.   Genitourinary: Negative.    Musculoskeletal: Negative.   Skin: Negative.  Neurological: Negative.   Hematological: Negative.   Psychiatric/Behavioral: Negative.       VITALS:  Blood pressure 139/73, pulse 71, temperature 98.4 F (36.9 C), resp. rate 18, height 5' (1.524 m), weight 193 lb (87.5 kg), SpO2 100 %.  Wt Readings from Last 3 Encounters:  07/22/22 193 lb (87.5 kg)  02/06/22 192 lb 12.8 oz (87.5 kg)  12/06/21 195 lb 9.6 oz (88.7 kg)    Body mass index is 37.69 kg/m.  Performance status (ECOG): 1 - Symptomatic but completely ambulatory  PHYSICAL EXAM:  Physical  Exam Constitutional:      General: She is not in acute distress.    Appearance: Normal appearance. She is normal weight.  HENT:     Head: Normocephalic and atraumatic.  Eyes:     General: No scleral icterus.    Extraocular Movements: Extraocular movements intact.     Conjunctiva/sclera: Conjunctivae normal.     Pupils: Pupils are equal, round, and reactive to light.  Cardiovascular:     Rate and Rhythm: Normal rate and regular rhythm.     Pulses: Normal pulses.     Heart sounds: Normal heart sounds. No murmur heard.    No friction rub. No gallop.  Pulmonary:     Effort: Pulmonary effort is normal. No respiratory distress.     Breath sounds: Normal breath sounds.  Chest:     Comments: Right mastectomy has a pink scar in the center it is well healed with no evidence of recurrence. Extensive fibrocystic changes in the left breast  which are tender. No masses in the left breast. Abdominal:     General: Bowel sounds are normal. There is no distension.     Palpations: Abdomen is soft. There is no mass.     Tenderness: There is no abdominal tenderness.  Musculoskeletal:        General: Normal range of motion.     Cervical back: Normal range of motion and neck supple.     Right lower leg: No edema.     Left lower leg: No edema.  Lymphadenopathy:     Cervical: No cervical adenopathy.  Skin:    General: Skin is warm and dry.  Neurological:     General: No focal deficit present.     Mental Status: She is alert and oriented to person, place, and time. Mental status is at baseline.  Psychiatric:        Mood and Affect: Mood normal.        Behavior: Behavior normal.        Thought Content: Thought content normal.        Judgment: Judgment normal.     LABS:      Latest Ref Rng & Units 07/22/2022    9:30 AM 10/25/2021   12:00 AM 07/08/2021   12:00 AM  CBC  WBC 4.0 - 10.5 K/uL 9.7  6.6     8.5      Hemoglobin 12.0 - 15.0 g/dL 50.3  54.6     56.8      Hematocrit 36.0 - 46.0 % 36.6   37     40      Platelets 150 - 400 K/uL 344  329     383         This result is from an external source.      Latest Ref Rng & Units 07/22/2022    9:30 AM 10/25/2021   12:00 AM 07/08/2021   12:00 AM  CMP  Glucose 70 - 99 mg/dL 98     BUN 6 - 20 mg/dL Creatinine 0.44 - 1.00 mg/dL 3.24  0.6     0.9   Sodium 135 - 145 mmol/L 141  140     139   Potassium 3.5 - 5.1 mmol/L 3.2  4.0     3.6   Chloride 98 - 111 mmol/L 97  96     98   CO2 22 - 32 mmol/L 30  34     35   Calcium 8.9 - 10.3 mg/dL 9.2  9.7     9.3   Total Protein 6.5 - 8.1 g/dL 7.5     Total Bilirubin 0.3 - 1.2 mg/dL 0.4     Alkaline Phos 38 - 126 U/L 103  97     90   AST 15 - 41 U/L 22  29     35   ALT 0 - 44 U/L This result is from an external source.     No results found for: "CEA1", "CEA" / No results found for: "CEA1", "CEA" No results found for: "PSA1" No results found for: "MWN027" No results found for: "CAN125"  No results found for: "TOTALPROTELP", "ALBUMINELP", "A1GS", "A2GS", "BETS", "BETA2SER", "GAMS", "MSPIKE", "SPEI" Lab Results  Component Value Date   IRONPCTSAT 11.1 (L) 03/15/2008   No results found for: "LDH"   STUDIES:  EXAM:06/27/2022 DIGITAL SCREENING UNILATERAL LEFT MAMMO WITH CAD AND TOMO IMPRESSION: No mammographic evidence of malignancy.   Allergies  Allergen Reactions   Milk (Cow) Diarrhea and Other (See Comments)    Other reaction(s): Abdominal Pain, Abdominal Pain   Avelox [Moxifloxacin Hcl In Nacl]    Codeine Other (See Comments)   Diazepam Other (See Comments)   Hydrocodone Other (See Comments)   Hydrocodone-Acetaminophen    Hydrocodone-Acetaminophen Other (See Comments)   Levofloxacin Other (See Comments)   Levofloxacin    Misc. Sulfonamide Containing Compounds Other (See Comments)   Moxifloxacin Other (See Comments)   Norethindrone Other (See Comments)   Oxycodone Hcl Other (See Comments)   Penicillins    Prednisone Swelling    Ropinirole Hydrochloride    Sulfonamide Derivatives    Topiramate Other (See Comments)   Corn Oil Other (See Comments)    unknown  Other reaction(s): Abdominal Pain   Fish Oil Other (See Comments)    INTESTINAL  Other reaction(s): Abdominal Pain   Other Other (See Comments)    INTESTINAL UPSET    Current Medications: Current Outpatient Medications  Medication Sig Dispense Refill   aspirin 81 MG EC tablet Take by mouth.     atorvastatin (LIPITOR) 40 MG tablet Take 40 mg by mouth daily.     calcium carbonate (OS-CAL) 600 MG tablet 600 mg.     Cholecalciferol (DIALYVITE VITAMIN D 5000 PO) Take by mouth in the morning and at bedtime.     cyclobenzaprine (FLEXERIL) 10 MG tablet cyclobenzaprine 10 mg tablet     dicyclomine (BENTYL) 20 MG tablet Take 20 mg by mouth 4 (four) times daily -  before meals and at bedtime.     famotidine (PEPCID) 40 MG tablet Take 40 mg by mouth 2 (two) times daily.     gabapentin (NEURONTIN) 600 MG tablet Take 600 mg by mouth 3 (three) times daily.     magnesium oxide (MAG-OX) 400 (240 Mg) MG tablet Take  400 mg by mouth daily.     metFORMIN (GLUCOPHAGE) 500 MG tablet Take by mouth at bedtime.     montelukast (SINGULAIR) 10 MG tablet      NON FORMULARY RitualX (HEMP extract)     omeprazole (PRILOSEC) 40 MG capsule Take 40 mg by mouth daily.     ondansetron (ZOFRAN) 4 MG tablet Take 4 mg by mouth 2 (two) times daily as needed for nausea or vomiting.     potassium chloride SA (KLOR-CON) 20 MEQ tablet potassium chloride ER 20 mEq tablet,extended release(part/cryst)     traMADol (ULTRAM) 50 MG tablet Take 50 mg by mouth every 6 (six) hours as needed.       Vitamin D, Ergocalciferol, (DRISDOL) 1.25 MG (50000 UNIT) CAPS capsule Take 50,000 Units by mouth every 7 (seven) days.     Wheat Dextrin (BENEFIBER DRINK MIX PO) Take by mouth.     No current facility-administered medications for this visit.     ASSESSMENT & PLAN:   Assessment:   1.  Remote history of  breast cancer, diagnosed in July 2003.  She remains without evidence of disease.  2.  Hypokalemia, resolved on oral supplement 20 meq daily.   Plan: I have suggested she start taking Vitamin E 400 IU once a day to help treat this as she has extensive fibrocystic disease and is already restricting her caffeine. I can see her back in 1 year with CBC and comprehensive metabolic profile and left mammogram.  She understands and agrees with this plan of care.   I provided 20 minutes of face-to-face time during this this encounter and > 50% was spent counseling as documented under my assessment and plan.    ADDENDUM: Her potassium is low again at 3.2 so I have urged her to be compliant with the supplement. Her anemia is stable with a hemoglobin of 11.7. The rest of her labs are normal.   Dellia Beckwith, MD [facility] AT [facility] [address] [place] [address] Dept: [phone] Dept Fax: [phone]    Avelina Laine as a scribe for Dellia Beckwith, MD.,have documented all relevant documentation on the behalf of Dellia Beckwith, MD,as directed by  Dellia Beckwith, MD while in the presence of Dellia Beckwith, MD.'

## 2022-07-22 NOTE — Telephone Encounter (Signed)
07/22/22 Next appt scheduled and confirmed with patient

## 2022-08-04 ENCOUNTER — Telehealth: Payer: Self-pay

## 2022-08-04 NOTE — Telephone Encounter (Signed)
Left detailed message and ask patient to call back with information of potassium supplement.Copy of labs faxed to PCP.

## 2022-08-04 NOTE — Telephone Encounter (Signed)
-----   Message from Belva Chimes, LPN sent at [id number] 11:50 AM EDT ----- Regarding: FW: call  ----- Message ----- From: Derwood Kaplan, MD Sent: 08/03/2022   5:13 PM EDT To: Belva Chimes, LPN; Dairl Ponder, RN Subject: call                                           Tell her labs all normal except low K and mild anemia (11.7). Is she taking her Potassium supplement? If not, she needs to.  If she is, needs to double it.  Send copies to her PCP

## 2023-03-19 ENCOUNTER — Ambulatory Visit: Payer: Medicare HMO | Admitting: Cardiology

## 2023-03-30 DIAGNOSIS — R42 Dizziness and giddiness: Secondary | ICD-10-CM | POA: Insufficient documentation

## 2023-03-30 NOTE — Progress Notes (Unsigned)
Cardiology Office Note:   Date:  03/31/2023  ID:  [address] [place], [place] 10-30-1970, MRN [id number] PCP: Erskine Emery, NP  [facility] Providers Cardiologist:  Rollene Rotunda, MD {  History of Present Illness:   Danielle Contreras is a 52 y.o. female who presents for evaluation of HTN. I saw her in 2012 and 2016 for chest pain.   I saw her last in Sept 2023 for management of HTN.  She had a negative stress test in 2012.  She had difficult to control HTN.      She presents for evaluation of palpitations and fluctuating blood pressures and chest pain.  She describes a sharp pinching chest discomfort that happens when her blood pressure drops her heart rate drops.  She says that she really does not tolerate taking medications.  She thinks taking her vitamin, metformin and potassium all caused her blood pressure to drop.  She does not like to take those together.  She does not want to take her propranolol frequently because her blood pressure will drop.  She says she does not feel good when her blood pressure is in the systolic 120s over the diastolics in the 70s.  She does not feel good going her heart rates in the 60s.  She has not had any frank syncope or presyncope.  She is not describing PND or orthopnea. .  ROS:   As stated in the HPI and negative for all other systems.  Studies Reviewed:    EKG:   EKG Interpretation Date/Time:  Tuesday March 31 2023 13:28:18 EST Ventricular Rate:  63 PR Interval:  114 QRS Duration:  88 QT Interval:  438 QTC Calculation: 448 R Axis:   78  Text Interpretation: Normal sinus rhythm Normal ECG RSR' or QR pattern in V1 suggests right ventricular conduction delay Confirmed by Rollene Rotunda ([id number]) on 03/31/2023 1:41:08 PM     Risk Assessment/Calculations:              Physical Exam:   VS:  BP 130/84 (BP Location: Left Arm, Patient Position: Sitting, Cuff Size: Normal)   Pulse 63   Ht 5' (1.524 m)   Wt 187 lb  (84.8 kg)   SpO2 98%   BMI 36.52 kg/m    Wt Readings from Last 3 Encounters:  03/31/23 187 lb (84.8 kg)  07/22/22 193 lb (87.5 kg)  02/06/22 192 lb 12.8 oz (87.5 kg)     GEN: Well nourished, well developed in no acute distress NECK: No JVD; No carotid bruits CARDIAC: RRR, no murmurs, rubs, gallops RESPIRATORY:  Clear to auscultation without rales, wheezing or rhonchi  ABDOMEN: Soft, non-tender, non-distended EXTREMITIES:  No edema; No deformity   ASSESSMENT AND PLAN:   HTN:    Her blood pressure is controlled.   She is very sensitive to any medications so not going to try to adjust these.  I tried to explain that her blood pressures well-controlled.  She wanted something just to treat the systolic blood pressures and not effective diastolic and I said this probably would not be possible.  Looking at her blood pressure diary I think she does not need any change in therapy.  Palpitations: She does have some SVT and I think she could use her propranolol 5 to 10 mg as needed if she is particularly symptomatic.  Chest pain/shortness of breath she will need a POET (Plain Old Exercise Treadmill)     Follow up with me as needed.  Signed, Rollene Rotunda, MD

## 2023-03-31 ENCOUNTER — Ambulatory Visit: Payer: Medicare HMO | Attending: Cardiology | Admitting: Cardiology

## 2023-03-31 ENCOUNTER — Encounter: Payer: Self-pay | Admitting: Cardiology

## 2023-03-31 VITALS — BP 130/84 | HR 63 | Ht 60.0 in | Wt 187.0 lb

## 2023-03-31 DIAGNOSIS — I10 Essential (primary) hypertension: Secondary | ICD-10-CM

## 2023-03-31 DIAGNOSIS — R42 Dizziness and giddiness: Secondary | ICD-10-CM

## 2023-03-31 DIAGNOSIS — R002 Palpitations: Secondary | ICD-10-CM | POA: Diagnosis not present

## 2023-03-31 DIAGNOSIS — R5383 Other fatigue: Secondary | ICD-10-CM

## 2023-03-31 NOTE — Patient Instructions (Addendum)
Medication Instructions:  No changes.  *If you need a refill on your cardiac medications before your next appointment, please call your pharmacy*    Testing/Procedures: Will schedule be at [address] Your physician has requested that you have an exercise tolerance test.. An exercise tolerance test is a test to check how your heart works during exercise. You will need to walk on a treadmill or for this test. An electrocardiogram (ECG) will record your heartbeat when you are at rest and when you are exercising.  Please also follow instruction sheet, as given.   Do not drink or eat foods with caffeine for 24 hours before the test. (Chocolate, coffee, tea, or energy drinks) If you use an inhaler, bring it with you to the test. Do not smoke for 4 hours before the test. Wear comfortable shoes and clothing.   Follow-Up: At [facility], you and your health needs are our priority.  As part of our continuing mission to provide you with exceptional heart care, we have created designated Provider Care Teams.  These Care Teams include your primary Cardiologist (physician) and Advanced Practice Providers (APPs -  Physician Assistants and Nurse Practitioners) who all work together to provide you with the care you need, when you need it.  We recommend signing up for the patient portal called "MyChart".  Sign up information is provided on this After Visit Summary.  MyChart is used to connect with patients for Virtual Visits (Telemedicine).  Patients are able to view lab/test results, encounter notes, upcoming appointments, etc.  Non-urgent messages can be sent to your provider as well.   To learn more about what you can do with MyChart, go to [email or web address].    Your next appointment:    As needed.   Provider:    Smitty Cords MD

## 2023-04-13 ENCOUNTER — Other Ambulatory Visit: Payer: Self-pay | Admitting: Cardiology

## 2023-04-16 ENCOUNTER — Ambulatory Visit: Payer: Medicare HMO

## 2023-05-01 ENCOUNTER — Ambulatory Visit: Payer: Medicare HMO | Admitting: Cardiology

## 2023-05-12 ENCOUNTER — Ambulatory Visit: Payer: Medicare HMO

## 2023-06-19 ENCOUNTER — Telehealth: Payer: Self-pay | Admitting: Hematology and Oncology

## 2023-06-19 NOTE — Telephone Encounter (Signed)
 06/19/23 Left msg to reschedule appts.

## 2023-06-30 LAB — HM MAMMOGRAPHY

## 2023-07-08 ENCOUNTER — Other Ambulatory Visit: Payer: Medicare HMO

## 2023-07-08 ENCOUNTER — Ambulatory Visit: Payer: Medicare HMO | Admitting: Hematology and Oncology

## 2023-07-09 ENCOUNTER — Telehealth: Payer: Self-pay | Admitting: Hematology and Oncology

## 2023-07-09 NOTE — Telephone Encounter (Signed)
 07/09/23 Patient left msg to cancel appts due to sickness.She will call back to reschedule.

## 2023-07-10 ENCOUNTER — Inpatient Hospital Stay: Payer: Medicare HMO

## 2023-07-10 ENCOUNTER — Inpatient Hospital Stay: Payer: Medicare HMO | Admitting: Hematology and Oncology

## 2023-07-20 ENCOUNTER — Encounter: Payer: Self-pay | Admitting: Cardiology

## 2023-07-29 ENCOUNTER — Encounter: Payer: Self-pay | Admitting: Hematology and Oncology

## 2023-07-29 ENCOUNTER — Inpatient Hospital Stay: Attending: Hematology and Oncology | Admitting: Hematology and Oncology

## 2023-07-29 ENCOUNTER — Other Ambulatory Visit: Payer: Self-pay

## 2023-07-29 ENCOUNTER — Inpatient Hospital Stay

## 2023-07-29 ENCOUNTER — Other Ambulatory Visit: Payer: Self-pay | Admitting: Hematology and Oncology

## 2023-07-29 VITALS — BP 138/65 | HR 72 | Temp 98.0°F | Resp 16 | Ht 60.0 in | Wt 178.4 lb

## 2023-07-29 DIAGNOSIS — Z90710 Acquired absence of both cervix and uterus: Secondary | ICD-10-CM | POA: Insufficient documentation

## 2023-07-29 DIAGNOSIS — Z9011 Acquired absence of right breast and nipple: Secondary | ICD-10-CM | POA: Diagnosis not present

## 2023-07-29 DIAGNOSIS — Z853 Personal history of malignant neoplasm of breast: Secondary | ICD-10-CM

## 2023-07-29 DIAGNOSIS — Z800 Family history of malignant neoplasm of digestive organs: Secondary | ICD-10-CM | POA: Insufficient documentation

## 2023-07-29 DIAGNOSIS — Z9221 Personal history of antineoplastic chemotherapy: Secondary | ICD-10-CM | POA: Insufficient documentation

## 2023-07-29 DIAGNOSIS — Z803 Family history of malignant neoplasm of breast: Secondary | ICD-10-CM | POA: Insufficient documentation

## 2023-07-29 DIAGNOSIS — R748 Abnormal levels of other serum enzymes: Secondary | ICD-10-CM

## 2023-07-29 DIAGNOSIS — Z90722 Acquired absence of ovaries, bilateral: Secondary | ICD-10-CM | POA: Insufficient documentation

## 2023-07-29 LAB — CBC WITH DIFFERENTIAL (CANCER CENTER ONLY)
Abs Immature Granulocytes: 0.02 10*3/uL (ref 0.00–0.07)
Basophils Absolute: 0.1 10*3/uL (ref 0.0–0.1)
Basophils Relative: 1 %
Eosinophils Absolute: 0.2 10*3/uL (ref 0.0–0.5)
Eosinophils Relative: 2 %
HCT: 38.2 % (ref 36.0–46.0)
Hemoglobin: 13.3 g/dL (ref 12.0–15.0)
Immature Granulocytes: 0 %
Lymphocytes Relative: 45 %
Lymphs Abs: 4 10*3/uL (ref 0.7–4.0)
MCH: 32 pg (ref 26.0–34.0)
MCHC: 34.8 g/dL (ref 30.0–36.0)
MCV: 91.8 fL (ref 80.0–100.0)
Monocytes Absolute: 0.6 10*3/uL (ref 0.1–1.0)
Monocytes Relative: 7 %
Neutro Abs: 4.1 10*3/uL (ref 1.7–7.7)
Neutrophils Relative %: 45 %
Platelet Count: 358 10*3/uL (ref 150–400)
RBC: 4.16 MIL/uL (ref 3.87–5.11)
RDW: 13.5 % (ref 11.5–15.5)
WBC Count: 9 10*3/uL (ref 4.0–10.5)
nRBC: 0 % (ref 0.0–0.2)
nRBC: 0 /100{WBCs}

## 2023-07-29 LAB — CMP (CANCER CENTER ONLY)
ALT: 15 U/L (ref 0–44)
AST: 29 U/L (ref 15–41)
Albumin: 4.6 g/dL (ref 3.5–5.0)
Alkaline Phosphatase: 145 U/L — ABNORMAL HIGH (ref 38–126)
Anion gap: 14 (ref 5–15)
BUN: 12 mg/dL (ref 6–20)
CO2: 34 mmol/L — ABNORMAL HIGH (ref 22–32)
Calcium: 11.3 mg/dL — ABNORMAL HIGH (ref 8.9–10.3)
Chloride: 98 mmol/L (ref 98–111)
Creatinine: 0.89 mg/dL (ref 0.44–1.00)
GFR, Estimated: >60 mL/min (ref >60)
Glucose, Bld: 106 mg/dL — ABNORMAL HIGH (ref 70–99)
Potassium: 3.9 mmol/L (ref 3.5–5.1)
Sodium: 146 mmol/L — ABNORMAL HIGH (ref 135–145)
Total Bilirubin: 0.4 mg/dL (ref 0.0–1.2)
Total Protein: 7.5 g/dL (ref 6.5–8.1)

## 2023-07-29 LAB — VITAMIN D 25 HYDROXY (VIT D DEFICIENCY, FRACTURES): Vit D, 25-Hydroxy: 35.86 ng/mL (ref 30–100)

## 2023-07-29 NOTE — Progress Notes (Signed)
 [facility] [address] [place],  [place]  [address] Contreras      PATIENT CARE TEAM: Patient Care Team: Danielle Emery, NP as PCP - Danielle Armour, MD as PCP - Cardiology (Cardiology)  DATE OF VISIT: 07/29/23   CLINIC:  Long Term Survivorship   REASON FOR VISIT:  Routine follow-up for history of breast cancer   BRIEF ONCOLOGIC HISTORY:  Danielle Contreras is a 53 y.o. female with a history of stage IIIA triple negative breast cancer diagnosed at age 28 in July 2003. She was treated with a right modified radical mastectomy, followed by adjuvant chemotherapy with doxorubicin and cyclophosphamide for 3 months followed by paclitaxel for 3 months.  She has had a total abdominal hysterectomy and bilateral salpingo-oophorectomy for endometriosis.  At her visit in 2016, there was concern over an area of thickening in the left breast, as well as increased pain.  She underwent a diagnostic left breast mammogram with ultrasound, as well as CT chest and bone scan.  There was no evidence of malignancy on mammogram and ultrasound the breast or CT chest.  There were degenerative changes on the bone scan, but no evidence of malignancy.  She testing for hereditary cancer syndromes with the Danielle Contreras in 2016. This did not reveal any clinically significant mutation or variant of uncertain significance.   Diagnostic left mammogram and ultrasound as well as MRI breasts were done in May 2012 for left breast pain and were without evidence of malignancy.  Annual screening mammograms remained negative.  She underwent diagnostic left mammogram and ultrasound, as well ultrasound of the right chest wall for changes on examination in November 2015.  No evidence of malignancy was seen.  CT chest and bone scan were also done at that time for cough and low back pain.  CT chest revealed bibasilar scarring.  Bone scan revealed degenerative changes.   Annual left screening mammograms remained negative.  MRI lumbar spine in August 2018 revealed far right lateral disc protrusion at L3-4 with moderate right foraminal stenosis, rightward disc protrusion and far right lateral annular tear with mild right foraminal narrowing at L4-5 and multilevel facet hypertrophy. In June 2019, MRI cervical spine for severe pain and numbness of the left arm revealed mild degenerative changes but no stenosis. There was a small left-sided disc protrusion at C6-7.  MRI cervical spine in June 2021 revealed grade 1 anterolisthesis of C6 on C7.  MRI cervical spine in December 2023 revealed left paracentral disc protrusion at C6-7 with mild spinal stenosis, mall right foraminal disc protrusion at C7-T1 with mild right C8 foraminal stenosis, broad-based central disc protrusion with uncovertebral spurring at C5-6 with mild right C6 foraminal stenosis and mild-to-moderate multilevel facet hypertrophy.  Her degenerative disease has been managed conservatively.  Over the years, she has had multiple CT scans of the chest, abdomen and pelvis, which did not reveal any evidence of malignancy, but none since 2022.  Colonoscopy with Dr. Charm Contreras in 2020 was negative.  Left screening mammogram in February 2023 revealed a possible mass in the left breast.  Follow-up diagnostic mammogram and ultrasound did not reveal any evidence of malignancy.  Left screening mammogram in February 2024 was negative.  Her mother had bilateral breast cancer at 11 and then at 42, a maternal aunt had breast cancer at uncertain age, a maternal niece had cervical cancer at an uncertain age, and a maternal uncle died of an uncertain malignancy at  an uncertain age.  Her mother was later diagnosed with pancreatic cancer and expired in 2020.  INTERVAL HISTORY:  Danielle Contreras [facility] today for routine follow-up for her history of breast cancer.  Overall, she reports feeling fairly well. She  denies any changes in her right mastectomy site or left breast.  She denies pain.  She has had to strain to urinate, but has not seen urologist.  She denies any changes in her family history.  In February, she had a screening left mammogram not reveal any evidence of malignancy.  Danielle Contreras had her undergo MRI lumbar spine on March 4 this revealed mild spondylitic changes most pronounced at L1-2 without significant spinal canal or neural foraminal narrowing.  No changes concerning for malignancy.   REVIEW OF SYSTEMS:  Review of Systems  Constitutional:  Negative for appetite change, chills, fatigue, fever and unexpected weight change.  HENT:   Negative for lump/mass, mouth sores and sore throat.   Respiratory:  Negative for cough and shortness of breath.   Cardiovascular:  Negative for chest pain and leg swelling.  Gastrointestinal:  Positive for constipation (Normal, mild) and nausea (Occasional, mild without vomiting). Negative for abdominal pain, diarrhea and vomiting.  Genitourinary:  Positive for difficulty urinating (Straining to urinate). Negative for dysuria, frequency and hematuria.   Musculoskeletal:  Positive for back pain (Intermittent lower back pain) and neck pain (Intermittent). Negative for arthralgias, gait problem and myalgias.  Skin:  Negative for itching and rash.  Neurological:  Negative for dizziness, extremity weakness, gait problem, headaches, light-headedness and numbness.  Hematological:  Negative for adenopathy. Does not bruise/bleed easily.  Psychiatric/Behavioral:  Negative for depression and sleep disturbance. The patient is not nervous/anxious.      PAST MEDICAL/SURGICAL HISTORY:  Past Medical History:  Diagnosis Date   Anxiety    Asthma    Bipolar 1 disorder (HCC)    Breast cancer (HCC)    Breast pain, left 10/25/2021   Depression    Fibrocystic breast    Fibromyalgia    Gastritis    GERD (gastroesophageal reflux disease)    Headache(784.0)    Heel spur     RIGHT   Hyperlipidemia    IBS (irritable bowel syndrome)    Lymphedema    right arm   Migraines    OSA (obstructive sleep apnea)    Current CPAP is not working   Panic attacks    RLS (restless legs syndrome)     Past Surgical History:  Procedure Laterality Date   ABDOMINAL HYSTERECTOMY  2005   BREAST LUMPECTOMY     Bilateral    KNEE SURGERY     LAPAROSCOPY  2005   MASTECTOMY     Right   OOPHORECTOMY     TONSILLECTOMY       ALLERGIES:  Allergies  Allergen Reactions   Milk (Cow) Diarrhea and Other (See Comments)    Other reaction(s): Abdominal Pain, Abdominal Pain   Avelox [Moxifloxacin Hcl In Nacl]    Codeine Other (See Comments)   Diazepam Other (See Comments)   Hydrocodone Other (See Comments)   Hydrocodone-Acetaminophen    Hydrocodone-Acetaminophen Other (See Comments)   Levofloxacin Other (See Comments)   Levofloxacin    Misc. Sulfonamide Containing Compounds Other (See Comments)   Moxifloxacin Other (See Comments)   Norethindrone Other (See Comments)   Oxycodone Hcl Other (See Comments)   Penicillins    Prednisone Swelling   Ropinirole Hydrochloride    Sulfonamide Derivatives  Topiramate Other (See Comments)   Corn Oil Other (See Comments)    unknown  Other reaction(s): Abdominal Pain   Fish Oil Other (See Comments)    INTESTINAL  Other reaction(s): Abdominal Pain   Other Other (See Comments)    INTESTINAL UPSET     CURRENT MEDICATIONS:  Outpatient Encounter Medications as of 07/29/2023  Medication Sig   fluticasone (FLONASE) 50 MCG/ACT nasal spray Place 2 sprays into both nostrils daily.   aspirin 81 MG EC tablet Take by mouth.   atorvastatin (LIPITOR) 40 MG tablet Take 40 mg by mouth daily.   calcium carbonate (OS-CAL) 600 MG tablet 600 mg.   Cholecalciferol (DIALYVITE VITAMIN D 5000 PO) Take by mouth in the morning and at bedtime.   cyclobenzaprine (FLEXERIL) 10 MG tablet cyclobenzaprine 10 mg tablet   dicyclomine (BENTYL) 20 MG tablet  Take 20 mg by mouth 4 (four) times daily -  before meals and at bedtime.   famotidine (PEPCID) 40 MG tablet Take 40 mg by mouth 2 (two) times daily. (Patient not taking: Reported on 07/29/2023)   gabapentin (NEURONTIN) 600 MG tablet Take 600 mg by mouth 3 (three) times daily.   magnesium oxide (MAG-OX) 400 (240 Mg) MG tablet Take 400 mg by mouth daily.   metFORMIN (GLUCOPHAGE) 500 MG tablet Take by mouth at bedtime.   montelukast (SINGULAIR) 10 MG tablet    NON FORMULARY RitualX (HEMP extract) (Patient not taking: Reported on 07/29/2023)   omeprazole (PRILOSEC) 40 MG capsule Take 40 mg by mouth daily.   ondansetron (ZOFRAN) 4 MG tablet Take 4 mg by mouth 2 (two) times daily as needed for nausea or vomiting.   potassium chloride SA (KLOR-CON) 20 MEQ tablet potassium chloride ER 20 mEq tablet,extended release(part/cryst)   traMADol (ULTRAM) 50 MG tablet Take 50 mg by mouth every 6 (six) hours as needed.     Vitamin D, Ergocalciferol, (DRISDOL) 1.25 MG (50000 UNIT) CAPS capsule Take 50,000 Units by mouth every 7 (seven) days.   Wheat Dextrin (BENEFIBER DRINK MIX PO) Take by mouth.   No facility-administered encounter medications on file as of 07/29/2023.     ONCOLOGIC FAMILY HISTORY:  Family History  Problem Relation Age of Onset   Coronary artery disease Brother        Vague history   Hypertension Brother    Stroke Maternal Grandmother    Heart disease Maternal Grandmother    Diabetes Mother        and Father    Breast cancer Mother    Hyperlipidemia Mother    Pancreatic cancer Mother    Diabetes Father    Hypertension Father    Hyperlipidemia Father    Colon cancer Neg Hx      SOCIAL HISTORY:   reports that she has never smoked. She has never used smokeless tobacco. She reports that she does not drink alcohol and does not use drugs.   PHYSICAL EXAMINATION:  Vital Signs: Vitals:   07/29/23 1411  BP: 138/65  Pulse: 72  Resp: 16  Temp: 98 F (36.7 C)  SpO2: 98%   Filed  Weights   07/29/23 1411  Weight: 178 lb 6.4 oz (80.9 kg)   Physical Exam Vitals and nursing note reviewed.  Constitutional:      General: She is not in acute distress.    Appearance: Normal appearance. She is not ill-appearing.  HENT:     Head: Normocephalic and atraumatic.     Mouth/Throat:     Mouth: Mucous  membranes are moist.     Pharynx: Oropharynx is clear. No oropharyngeal exudate or posterior oropharyngeal erythema.  Eyes:     General: No scleral icterus.    Extraocular Movements: Extraocular movements intact.     Conjunctiva/sclera: Conjunctivae normal.     Pupils: Pupils are equal, round, and reactive to light.  Cardiovascular:     Rate and Rhythm: Normal rate and regular rhythm.     Heart sounds: Normal heart sounds. No murmur heard.    No friction rub. No gallop.  Pulmonary:     Effort: Pulmonary effort is normal.     Breath sounds: Normal breath sounds. No wheezing, rhonchi or rales.  Chest:  Breasts:    Right: Absent.     Left: Normal. No swelling, bleeding, inverted nipple, mass, nipple discharge, skin change or tenderness.     Comments: Right mastectomy site is negative. Abdominal:     General: There is no distension.     Palpations: Abdomen is soft. There is no hepatomegaly, splenomegaly or mass.     Tenderness: There is no abdominal tenderness.  Musculoskeletal:        General: Normal range of motion.     Cervical back: Normal range of motion and neck supple. No tenderness.     Right lower leg: No edema.     Left lower leg: No edema.  Lymphadenopathy:     Cervical: No cervical adenopathy.     Upper Body:     Right upper body: No supraclavicular or axillary adenopathy.     Left upper body: No supraclavicular or axillary adenopathy.     Lower Body: No right inguinal adenopathy. No left inguinal adenopathy.  Skin:    General: Skin is warm and dry.     Coloration: Skin is not jaundiced.     Findings: No rash.  Neurological:     Mental Status: She is  alert and oriented to person, place, and time.     Cranial Nerves: No cranial nerve deficit.  Psychiatric:        Mood and Affect: Mood normal.        Behavior: Behavior normal.        Thought Content: Thought content normal.      LABORATORY DATA:     Latest Ref Rng & Units 07/29/2023    1:48 PM 07/22/2022    9:30 AM 10/25/2021   12:00 AM  CBC  WBC 4.0 - 10.5 K/uL 9.0  9.7  6.6      Hemoglobin 12.0 - 15.0 g/dL 74.2  59.5  63.8      Hematocrit 36.0 - 46.0 % 38.2  36.6  37      Platelets 150 - 400 K/uL 358  344  329         This result is from an external source.      Latest Ref Rng & Units 07/29/2023    1:48 PM 07/22/2022    9:30 AM 10/25/2021   12:00 AM  CMP  Glucose 70 - 99 mg/dL 756  98    BUN 6 - 20 mg/dL 12  10  13       Creatinine 0.44 - 1.00 mg/dL 4.33  2.95  0.6      Sodium 135 - 145 mmol/L 146  141  140      Potassium 3.5 - 5.1 mmol/L 3.9  3.2  4.0      Chloride 98 - 111 mmol/L 98  97  96  CO2 22 - 32 mmol/L 34  30  34      Calcium 8.9 - 10.3 mg/dL 81.1  9.2  9.7      Total Protein 6.5 - 8.1 g/dL 7.5  7.5    Total Bilirubin 0.0 - 1.2 mg/dL 0.4  0.4    Alkaline Phos 38 - 126 U/L 145  103  97      AST 15 - 41 U/L 29  22  29       ALT 0 - 44 U/L 15  14  14          This result is from an external source.    DIAGNOSTIC IMAGING:    Exam(s): [id number] MAM/MAM DIGITAL TOMO SCREENING L  EXAM:  MAM DIGITAL TOMO SCREENING L  UNILATERAL LEFT DIGITAL SCREENING MAMMOGRAM WITH TOMOSYNTHESIS TECHNIQUE AND CAD, 3D MAMMOGRAM  DATE: 06/30/2023  CLINICAL HISTORY:  Screening for breast cancer. Personal history of breast cancer with prior right mastectomy reportedly in 2003.  COMPARISON:  Prior exam: 06/27/2022, 07/10/2021, 06/13/2021, 06/12/2020.  TECHNIQUE:  Digital mammography was performed with routine views and tomographic technique. Standard MLO and CC views of the breast, with some additional views as needed. Computer-aided detection was utilized.  FINDINGS:  Prior  right mastectomy is acknowledged. There is no dominant mass. No spiculated density. No suspicious calcification. No significant change compared to prior exams.  TISSUE DENSITY: Category A: The breast tissue is almost entirely fatty.  IMPRESSION:  No mammographic evidence of malignancy left breast.  ASSESSMENT: BIRADS 2 BENIGN FINDING  RECOMMENDATION:  1: ROUTINE ANNUAL FOLLOW-UP Left in 1 Year      Exam(s): [id number] MRI/MRI LUMBAR SPINE W/O CM  PROCEDURE:  MRI LUMBAR SPINE W/O CM  REASON FOR EXAM:  LUMBAR RADICULOPATHY  TECHNIQUE:  Multiplanar and multisequential MRI of the lumbar spine was performed without contrast.  COMPARISON:  Lumbar spine MRI 12/20/2016. Radiographs 05/02/2022.  FINDINGS:  5 non-rib-bearing lumbar vertebrae are preserved in height, with anatomic alignment and normal marrow signal. Mild multilevel spondylotic changes, most pronounced at the L1-2 level with disc desiccation and narrowing, anterior endplate osteophytosis, and hypertrophic facet arthropathy. There is no significant disc bulging, spinal canal or neural foraminal narrowing. The conus is normal in signal and morphology, and terminates at L1. The visualized cauda equina nerve roots are normal. Unremarkable paravertebral soft tissues.  IMPRESSION:  Mild spondylotic changes most pronounced at L1-2.  No significant spinal canal or neural foraminal narrowing.     ASSESSMENT AND PLAN: Ms.. Thurow is a pleasant 53 y.o. female with history of stage IIIA triple negative right breast cancer, diagnosed in July 2003. She was treated with right mastectomy and adjuvant chemotherapy.   1. History of stage IIIA breast cancer:  Ms. Cayabyab is currently clinically and radiographically without evidence of local disease recurrence.  2. Hypercalcemia: She is currently taking calcium 600 mg daily, so I will have her hold that.  She is also taking weekly high dose vitamin D, in addition to vitamin D 5000 international  units daily.  I will obtain a PTH and vitamin D level today.  As her alkaline phosphatase is also mildly elevated, I will also schedule her for a bone scan.  3. Cancer screening:  Due to Ms. Labrake's history and age, she should receive screening for skin cancers and colon cancer. The patient was encouraged to follow-up with her providers for appropriate cancer screenings.   4. Health maintenance and wellness promotion: Ms. Goetsch was encouraged to consume 5-7 servings  of fruits and vegetables per day. The patient was also encouraged to engage in moderate to vigorous exercise for 30 minutes per day most days of the week. Ms. Sidell was instructed to limit her alcohol consumption and continue to abstain from tobacco use.    Disposition:  -Return to Cancer Center in 2 weeks for the results of her bone scan with a repeat CMP.  A total of 45 minutes of face-to-face time was spent with this patient with greater than 50% of that time in counseling and care-coordination.   Milan Clare A. Sol Passer, PA-C Physician Assistant [facility]   Note: PRIMARY CARE PROVIDER Danielle Contreras, [place] [phone]

## 2023-07-31 LAB — PTH, INTACT AND CALCIUM
Calcium, Total (PTH): 11.4 mg/dL — ABNORMAL HIGH (ref 8.7–10.2)
PTH: 12 pg/mL — ABNORMAL LOW (ref 15–65)

## 2023-08-12 ENCOUNTER — Encounter: Payer: Self-pay | Admitting: Hematology and Oncology

## 2023-08-13 ENCOUNTER — Other Ambulatory Visit

## 2023-08-13 ENCOUNTER — Ambulatory Visit: Admitting: Oncology

## 2023-08-17 ENCOUNTER — Telehealth: Payer: Self-pay

## 2023-08-17 NOTE — Telephone Encounter (Signed)
-----   Message from Adah Perl sent at 08/12/2023  4:02 PM EDT ----- Please let her know her bone scan was negative. Lab did not show parathyroid problem. Keep appt with Dr. Shyrl Numbers. Thanks

## 2023-08-17 NOTE — Telephone Encounter (Signed)
 Attempted to contact patient. No answer and no VM.

## 2023-08-18 ENCOUNTER — Other Ambulatory Visit: Payer: Self-pay | Admitting: Oncology

## 2023-08-18 ENCOUNTER — Inpatient Hospital Stay: Admitting: Oncology

## 2023-08-18 ENCOUNTER — Encounter: Payer: Self-pay | Admitting: Oncology

## 2023-08-18 ENCOUNTER — Telehealth: Payer: Self-pay | Admitting: Oncology

## 2023-08-18 ENCOUNTER — Inpatient Hospital Stay: Attending: Oncology

## 2023-08-18 VITALS — BP 125/68 | HR 70 | Temp 97.8°F | Resp 16 | Ht 60.0 in | Wt 182.6 lb

## 2023-08-18 DIAGNOSIS — Z90722 Acquired absence of ovaries, bilateral: Secondary | ICD-10-CM | POA: Insufficient documentation

## 2023-08-18 DIAGNOSIS — C50911 Malignant neoplasm of unspecified site of right female breast: Secondary | ICD-10-CM

## 2023-08-18 DIAGNOSIS — Z9011 Acquired absence of right breast and nipple: Secondary | ICD-10-CM | POA: Diagnosis not present

## 2023-08-18 DIAGNOSIS — Z9221 Personal history of antineoplastic chemotherapy: Secondary | ICD-10-CM | POA: Insufficient documentation

## 2023-08-18 DIAGNOSIS — Z853 Personal history of malignant neoplasm of breast: Secondary | ICD-10-CM | POA: Insufficient documentation

## 2023-08-18 DIAGNOSIS — Z90710 Acquired absence of both cervix and uterus: Secondary | ICD-10-CM | POA: Diagnosis not present

## 2023-08-18 DIAGNOSIS — Z1239 Encounter for other screening for malignant neoplasm of breast: Secondary | ICD-10-CM

## 2023-08-18 DIAGNOSIS — Z800 Family history of malignant neoplasm of digestive organs: Secondary | ICD-10-CM | POA: Insufficient documentation

## 2023-08-18 DIAGNOSIS — Z803 Family history of malignant neoplasm of breast: Secondary | ICD-10-CM | POA: Insufficient documentation

## 2023-08-18 LAB — CMP (CANCER CENTER ONLY)
ALT: 10 U/L (ref 0–44)
AST: 27 U/L (ref 15–41)
Albumin: 4.3 g/dL (ref 3.5–5.0)
Alkaline Phosphatase: 145 U/L — ABNORMAL HIGH (ref 38–126)
Anion gap: 10 (ref 5–15)
BUN: 10 mg/dL (ref 6–20)
CO2: 34 mmol/L — ABNORMAL HIGH (ref 22–32)
Calcium: 10.3 mg/dL (ref 8.9–10.3)
Chloride: 97 mmol/L — ABNORMAL LOW (ref 98–111)
Creatinine: 0.8 mg/dL (ref 0.44–1.00)
GFR, Estimated: >60 mL/min (ref >60)
Glucose, Bld: 90 mg/dL (ref 70–99)
Potassium: 3.3 mmol/L — ABNORMAL LOW (ref 3.5–5.1)
Sodium: 141 mmol/L (ref 135–145)
Total Bilirubin: 0.4 mg/dL (ref 0.0–1.2)
Total Protein: 7 g/dL (ref 6.5–8.1)

## 2023-08-18 NOTE — Telephone Encounter (Signed)
 Patient has been scheduled for follow-up visit per 08/18/23 LOS.  Pt given an appt calendar with date and time.

## 2023-08-18 NOTE — Progress Notes (Signed)
 [facility] [address] [place],  [place]  [address] [phone]      PATIENT CARE TEAM: Patient Care Team: Marce Sensing, NP as PCP - Pamela Boeck, MD as PCP - Cardiology (Cardiology)  DATE OF VISIT: 08/18/2023   REASON FOR VISIT:  Routine follow-up for history of breast cancer  BRIEF ONCOLOGIC HISTORY:  Danielle Contreras is a 53 y.o. female with a history of stage IIIA triple negative breast cancer diagnosed at age 54 in July 2003. She was treated with a right modified radical mastectomy, followed by adjuvant chemotherapy with doxorubicin and cyclophosphamide for 3 months followed by paclitaxel for 3 months.  She has had a total abdominal hysterectomy and bilateral salpingo-oophorectomy for endometriosis.  At her visit in 2016, there was concern over an area of thickening in the left breast, as well as increased pain.  She underwent a diagnostic left breast mammogram with ultrasound, as well as CT chest and bone scan.  There was no evidence of malignancy on mammogram and ultrasound the breast or CT chest.  There were degenerative changes on the bone scan, but no evidence of malignancy.  She testing for hereditary cancer syndromes with the [employer] myRisk Hereditary Cancer Panel in 2016. This did not reveal any clinically significant mutation or variant of uncertain significance.   Diagnostic left mammogram and ultrasound as well as MRI breasts were done in May 2012 for left breast pain and were without evidence of malignancy.  Annual screening mammograms remained negative.  She underwent diagnostic left mammogram and ultrasound, as well ultrasound of the right chest wall for changes on examination in November 2015.  No evidence of malignancy was seen.  CT chest and bone scan were also done at that time for cough and low back pain.  CT chest revealed bibasilar scarring.  Bone scan revealed degenerative changes.  Annual left screening mammograms  remained negative.  MRI lumbar spine in August 2018 revealed far right lateral disc protrusion at L3-4 with moderate right foraminal stenosis, rightward disc protrusion and far right lateral annular tear with mild right foraminal narrowing at L4-5 and multilevel facet hypertrophy. In June 2019, MRI cervical spine for severe pain and numbness of the left arm revealed mild degenerative changes but no stenosis. There was a small left-sided disc protrusion at C6-7.  MRI cervical spine in June 2021 revealed grade 1 anterolisthesis of C6 on C7.  MRI cervical spine in December 2023 revealed left paracentral disc protrusion at C6-7 with mild spinal stenosis, mall right foraminal disc protrusion at C7-T1 with mild right C8 foraminal stenosis, broad-based central disc protrusion with uncovertebral spurring at C5-6 with mild right C6 foraminal stenosis and mild-to-moderate multilevel facet hypertrophy.  Her degenerative disease has been managed conservatively.  Over the years, she has had multiple CT scans of the chest, abdomen and pelvis, which did not reveal any evidence of malignancy, but none since 2022.  Colonoscopy with Dr. Randal Bury in 2020 was negative. Left screening mammogram in February 2023 revealed a possible mass in the left breast.  Follow-up diagnostic mammogram and ultrasound did not reveal any evidence of malignancy.  Left screening mammogram in February 2024 was negative.Her mother had bilateral breast cancer at 32 and then at 17, a maternal aunt had breast cancer at uncertain age, a maternal niece had cervical cancer at an uncertain age, and a maternal uncle died of an uncertain malignancy at an uncertain age.  Her mother was later diagnosed with pancreatic  cancer and expired in 2020.  INTERVAL HISTORY:  [facility]le [facility]re[facility] for routine follow-up for her history of breast cancer. Patient states that she feels well and has no complaints of pain. She had a unilateral  left screening mammogram done on 06/30/2023 and bone scan done on 08/05/2023 that was clear. In March her calcium levels were elevated at 11.3 and we discontinued her oral calcium supplement. She also had a low PTH of 12 and a elevated calcium (PTH) of 11.4. Her CMP today is normal including a calcium of 10.3, other than a low potassium of 3.3 and elevated alkaline phosphatase of 145. I encouraged her to take an oral potassium supplement as tolerated and increase it in her diet at home. She informed me that she has to limit her potassium supplement intake as this decreases her BP even more after taking her BP medication. Patient inquired about a prescription to [facility] and I explained to her the process on how to get the mastectomy bras and breast prothesis. I will see her back in 1 year with CBC, CMP, and she will have the unilateral left screening mammogram the week before. We will schedule it in February, 2026 to coincide with her mammography.    She denies signs of infection such as sore throat, sinus drainage, cough, or urinary symptoms.  She denies fevers or recurrent chills. She denies pain. She denies nausea, vomiting, chest pain, dyspnea or cough. Her appetite is good and her weight has increased 4 pounds over last month .    REVIEW OF SYSTEMS:  Review of Systems  Constitutional: Negative.  Negative for appetite change, chills, fatigue, fever and unexpected weight change.  HENT:  Negative.  Negative for lump/mass, mouth sores and sore throat.   Eyes: Negative.   Respiratory: Negative.  Negative for chest tightness, cough, hemoptysis, shortness of breath and wheezing.   Cardiovascular: Negative.  Negative for chest pain, leg swelling and palpitations.  Gastrointestinal: Negative.  Negative for abdominal distention, abdominal pain, blood in stool, constipation, diarrhea and vomiting.  Endocrine: Negative.   Genitourinary: Negative.  Negative for difficulty urinating, dysuria, frequency  and hematuria.   Musculoskeletal:  Positive for back pain (Intermittent lower back pain) and neck pain (Intermittent). Negative for arthralgias, flank pain, gait problem and myalgias.  Skin: Negative.  Negative for itching and rash.  Neurological:  Negative for dizziness, extremity weakness, gait problem, headaches, light-headedness, numbness, seizures and speech difficulty.  Hematological: Negative.  Negative for adenopathy. Does not bruise/bleed easily.  Psychiatric/Behavioral: Negative.  Negative for depression and sleep disturbance. The patient is not nervous/anxious.    PAST MEDICAL/SURGICAL HISTORY:  Past Medical History:  Diagnosis Date   Anxiety    Asthma    Bipolar 1 disorder (HCC)    Breast cancer (HCC)    Breast pain, left 10/25/2021   Depression    Fibrocystic breast    Fibromyalgia    Gastritis    GERD (gastroesophageal reflux disease)    Headache(784.0)    Heel spur    RIGHT   Hyperlipidemia    IBS (irritable bowel syndrome)    Lymphedema    right arm   Migraines    OSA (obstructive sleep apnea)    Current CPAP is not working   Panic attacks    RLS (restless legs syndrome)     Past Surgical History:  Procedure Laterality Date   ABDOMINAL HYSTERECTOMY  2005   BREAST LUMPECTOMY     Bilateral  KNEE SURGERY     LAPAROSCOPY  2005   MASTECTOMY     Right   OOPHORECTOMY     TONSILLECTOMY     ALLERGIES:  Allergies  Allergen Reactions   Milk (Cow) Diarrhea and Other (See Comments)    Other reaction(s): Abdominal Pain, Abdominal Pain   Avelox [Moxifloxacin Hcl In Nacl]    Codeine Other (See Comments)   Diazepam Other (See Comments)   Hydrocodone Other (See Comments)   Hydrocodone-Acetaminophen    Hydrocodone-Acetaminophen Other (See Comments)   Levofloxacin Other (See Comments)   Levofloxacin    Misc. Sulfonamide Containing Compounds Other (See Comments)   Moxifloxacin Other (See Comments)   Norethindrone Other (See Comments)   Oxycodone Hcl Other  (See Comments)   Penicillins    Prednisone Swelling   Ropinirole Hydrochloride    Sulfonamide Derivatives    Topiramate Other (See Comments)   Corn Oil Other (See Comments)    unknown  Other reaction(s): Abdominal Pain   Fish Oil Other (See Comments)    INTESTINAL  Other reaction(s): Abdominal Pain   Other Other (See Comments)    INTESTINAL UPSET   CURRENT MEDICATIONS:  Outpatient Encounter Medications as of 07/29/2023  Medication Sig   fluticasone (FLONASE) 50 MCG/ACT nasal spray Place 2 sprays into both nostrils daily.   aspirin 81 MG EC tablet Take by mouth.   atorvastatin (LIPITOR) 40 MG tablet Take 40 mg by mouth daily.   calcium carbonate (OS-CAL) 600 MG tablet 600 mg.   Cholecalciferol (DIALYVITE VITAMIN D 5000 PO) Take by mouth in the morning and at bedtime.   cyclobenzaprine (FLEXERIL) 10 MG tablet cyclobenzaprine 10 mg tablet   dicyclomine (BENTYL) 20 MG tablet Take 20 mg by mouth 4 (four) times daily -  before meals and at bedtime.   famotidine (PEPCID) 40 MG tablet Take 40 mg by mouth 2 (two) times daily. (Patient not taking: Reported on 07/29/2023)   gabapentin (NEURONTIN) 600 MG tablet Take 600 mg by mouth 3 (three) times daily.   magnesium oxide (MAG-OX) 400 (240 Mg) MG tablet Take 400 mg by mouth daily.   metFORMIN (GLUCOPHAGE) 500 MG tablet Take by mouth at bedtime.   montelukast (SINGULAIR) 10 MG tablet    NON FORMULARY RitualX (HEMP extract) (Patient not taking: Reported on 07/29/2023)   omeprazole (PRILOSEC) 40 MG capsule Take 40 mg by mouth daily.   ondansetron (ZOFRAN) 4 MG tablet Take 4 mg by mouth 2 (two) times daily as needed for nausea or vomiting.   potassium chloride SA (KLOR-CON) 20 MEQ tablet potassium chloride ER 20 mEq tablet,extended release(part/cryst)   traMADol (ULTRAM) 50 MG tablet Take 50 mg by mouth every 6 (six) hours as needed.     Vitamin D, Ergocalciferol, (DRISDOL) 1.25 MG (50000 UNIT) CAPS capsule Take 50,000 Units by mouth every 7  (seven) days.   Wheat Dextrin (BENEFIBER DRINK MIX PO) Take by mouth.   No facility-administered encounter medications on file as of 07/29/2023.    ONCOLOGIC FAMILY HISTORY:  Family History  Problem Relation Age of Onset   Coronary artery disease Brother        Vague history   Hypertension Brother    Stroke Maternal Grandmother    Heart disease Maternal Grandmother    Diabetes Mother        and Father    Breast cancer Mother    Hyperlipidemia Mother    Pancreatic cancer Mother    Diabetes Father    Hypertension Father  Hyperlipidemia Father    Colon cancer Neg Hx    SOCIAL HISTORY:   reports that she has never smoked. She has never used smokeless tobacco. She reports that she does not drink alcohol and does not use drugs.  VITALS: Vitals:   08/18/23 1149  BP: 125/68  Pulse: 70  Resp: 16  Temp: 97.8 F (36.6 C)  SpO2: 99%   Wt Readings from Last 3 Encounters:  08/18/23 182 lb 9.6 oz (82.8 kg)  07/29/23 178 lb 6.4 oz (80.9 kg)  03/31/23 187 lb (84.8 kg)   PHYSICAL EXAMINATION:  Physical Exam Vitals and nursing note reviewed.  Constitutional:      General: She is not in acute distress.    Appearance: Normal appearance. She is normal weight. She is not ill-appearing, toxic-appearing or diaphoretic.  HENT:     Head: Normocephalic and atraumatic.     Right Ear: Tympanic membrane, ear canal and external ear normal. There is no impacted cerumen.     Left Ear: Tympanic membrane, ear canal and external ear normal. There is no impacted cerumen.     Nose: Nose normal. No congestion or rhinorrhea.     Mouth/Throat:     Mouth: Mucous membranes are moist.     Pharynx: Oropharynx is clear. No oropharyngeal exudate or posterior oropharyngeal erythema.  Eyes:     General: No scleral icterus.       Right eye: No discharge.        Left eye: No discharge.     Extraocular Movements: Extraocular movements intact.     Conjunctiva/sclera: Conjunctivae normal.     Pupils: Pupils  are equal, round, and reactive to light.  Cardiovascular:     Rate and Rhythm: Normal rate and regular rhythm.     Pulses: Normal pulses.     Heart sounds: Normal heart sounds. No murmur heard.    No friction rub. No gallop.  Pulmonary:     Effort: Pulmonary effort is normal.     Breath sounds: Normal breath sounds. No wheezing, rhonchi or rales.  Chest:  Breasts:    Right: Absent.     Left: Normal. No swelling, bleeding, inverted nipple, mass, nipple discharge, skin change or tenderness.     Comments: Right mastectomy site is negative. Healed scar from previous port in the left upper chest Left breast is without masses Abdominal:     General: Bowel sounds are normal. There is no distension.     Palpations: Abdomen is soft. There is no hepatomegaly, splenomegaly or mass.     Tenderness: There is no abdominal tenderness. There is no right CVA tenderness, left CVA tenderness, guarding or rebound.     Hernia: No hernia is present.  Musculoskeletal:        General: Normal range of motion.     Cervical back: Normal range of motion and neck supple. No tenderness.     Right lower leg: No edema.     Left lower leg: No edema.  Lymphadenopathy:     Cervical: No cervical adenopathy.     Upper Body:     Right upper body: No supraclavicular or axillary adenopathy.     Left upper body: No supraclavicular or axillary adenopathy.     Lower Body: No right inguinal adenopathy. No left inguinal adenopathy.  Skin:    General: Skin is warm and dry.     Coloration: Skin is not jaundiced.     Findings: No rash.  Neurological:  General: No focal deficit present.     Mental Status: She is alert and oriented to person, place, and time.     Cranial Nerves: No cranial nerve deficit.  Psychiatric:        Mood and Affect: Mood normal.        Behavior: Behavior normal.        Thought Content: Thought content normal.        Judgment: Judgment normal.      LABORATORY DATA:     Latest Ref Rng &  Units 07/29/2023    1:48 PM 07/22/2022    9:30 AM 10/25/2021   12:00 AM  CBC  WBC 4.0 - 10.5 K/uL 9.0  9.7  6.6      Hemoglobin 12.0 - 15.0 g/dL 16.1  09.6  04.5      Hematocrit 36.0 - 46.0 % 38.2  36.6  37      Platelets 150 - 400 K/uL 358  344  329         This result is from an external source.      Latest Ref Rng & Units 08/18/2023   10:51 AM 07/29/2023    1:48 PM 07/22/2022    9:30 AM  CMP  Glucose 70 - 99 mg/dL 90  409  98   BUN 6 - 20 mg/dL 10  12  10    Creatinine 0.44 - 1.00 mg/dL 8.11  9.14  7.82   Sodium 135 - 145 mmol/L 141  146  141   Potassium 3.5 - 5.1 mmol/L 3.3  3.9  3.2   Chloride 98 - 111 mmol/L 97  98  97   CO2 22 - 32 mmol/L 34  34  30   Calcium 8.9 - 10.3 mg/dL 95.6  21.3    08.6  9.2   Total Protein 6.5 - 8.1 g/dL 7.0  7.5  7.5   Total Bilirubin 0.0 - 1.2 mg/dL 0.4  0.4  0.4   Alkaline Phos 38 - 126 U/L 145  145  103   AST 15 - 41 U/L 27  29  22    ALT 0 - 44 U/L 10  15  14     Lab Results  Component Value Date   PTH 12 (L) 07/29/2023   PTH Comment 07/29/2023   CALCIUM 10.3 08/18/2023   Lab Results  Component Value Date   VD25OH 35.86 07/29/2023    DIAGNOSTIC IMAGING:      ASSESSMENT AND PLAN: Ms.. Contreras is a pleasant 53 y.o. female with history of stage IIIA triple negative right breast cancer, diagnosed in July 2003. She was treated with right mastectomy and adjuvant chemotherapy.   1. History of stage IIIA breast cancer:  Danielle Contreras is currently clinically and radiographically without evidence of local disease recurrence.  2. Hypercalcemia: We have stopped her calcium 600 mg supplement.  She is also taking weekly high dose vitamin D, in addition to vitamin D 5000 international units daily.  She had a low PTH of 12 and a elevated calcium (PTH) of 11.4.  Her alkaline phosphatase was mildly elevated and a bone scan done on 08/05/2023 was clear.  The calcium has now normalized.  3. Cancer screening:  Due to Danielle Contreras's history and age, she  should receive screening for skin cancers and colon cancer. The patient was encouraged to follow-up with her providers for appropriate cancer screenings.   4. Health maintenance and wellness promotion: Danielle Contreras was encouraged to consume 5-7 servings of  fruits and vegetables per day. The patient was also encouraged to engage in moderate to vigorous exercise for 30 minutes per day most days of the week. Danielle Contreras was instructed to limit her alcohol consumption and continue to abstain from tobacco use.   Disposition:  She had a unilateral left screening mammogram done on 06/30/2023 and bone scan done on 08/05/2023 that was clear. In March her calcium levels were elevated at 11.3 and we discontinued her oral calcium supplement. She also had a low PTH of 12 and a elevated calcium (PTH) of 11.4. Her CMP today is normal including a calcium of 10.3, other than a low potassium of 3.3 and elevated alkaline phosphatase of 145. I encouraged her to take an oral potassium supplement as tolerated and increase it in her diet at home. She informed me that she has to limit her potassium supplement intake as this decreases her BP even more after taking her BP medication. Patient inquired about a prescription to [facility] and I explained to her the process on how to get the mastectomy bras and breast prothesis. I will see her back in 1 year with CBC, CMP, and she will have the unilateral left screening mammogram the week before. We will schedule it in February, 2026 to coincide with her mammography.    A total of 15 minutes of face-to-face time was spent with this patient with greater than 50% of that time in counseling and care-coordination.  Nolia Baumgartner, MD [facility] - A DEPT OF [facility] [address] [place] [address] Dept: [phone] Dept Fax: [phone]   No orders of the defined types were placed in this  encounter.   Note: PRIMARY CARE PROVIDER Marce Sensing, [place] [phone]   Ambrosio Bailey Lassiter,acting as a scribe for Nolia Baumgartner, MD.,have documented all relevant documentation on the behalf of Nolia Baumgartner, MD,as directed by  Nolia Baumgartner, MD while in the presence of Nolia Baumgartner, MD.

## 2023-10-14 ENCOUNTER — Telehealth: Payer: Self-pay | Admitting: Cardiology

## 2023-10-14 NOTE — Progress Notes (Unsigned)
 Cardiology Office Note    Date:  10/16/2023  ID:  [id number] [place], [place] Nov 20, 1970, MRN [id number] PCP:  Marce Sensing, NP  Cardiologist:  Eilleen Grates, MD  Electrophysiologist:  None   Chief Complaint: Hypertension   History of Present Illness: Danielle Contreras Danielle Contreras is a 53 y.o. female with visit-pertinent history of anxiety, asthma, bipolar disorder, breast cancer, depression, fibromyalgia, gastritis, GERD, hyperlipidemia, IBS, lymphedema of the right arm, migraines, OSA and panic attacks.  Patient was seen in 09/2019 by Dr. Lavonne Prairie for evaluation of hypertension.  He had previously seen her in 2012 and 2016 for past pain.  She did negative stress test in 2012.  In 2021 it was noted that patient had blood pressures at her PCPs office in the 170s over 100s and was started on metoprolol and amlodipine .  At cardiology visit her blood pressure was well-controlled.  Patient noted to have sensitive to medications, at follow-up in 12/2020 she had stopped her amlodipine , was requested to restart amlodipine  2.5 mg at night and keep a blood pressure log, was noted that she had not been using her CPAP device.  On follow-up patient reported that when taking amlodipine  her blood pressure dropped to below and the medication was discontinued.  She was seen in clinic on 12/06/2021 by Lawana Pray, NP with reports of increased heart rate, dizziness and shortness of breath.  Patient noted she had been taking a quarter of a pill of metoprolol which resulted in increased dizziness, she was started metoprolol 10 mg 3 times daily as needed.  A cardiac event monitor was ordered which indicated normal sinus rhythm was her predominant rhythm, showed occasional runs of supraventricular tachycardia with the longest being 8 beats, 6 shorter runs.  There was rare ventricular and supraventricular ectopy.  With no sustained arrhythmias.  Patient was last seen in clinic by Dr. Lavonne Prairie on 03/31/2023 for  evaluation of palpitations and fluctuating blood pressures and chest pain.  She described a sharp pinching chest discomfort that happened when her blood pressure or heart rate dropped.  She reported that her vitamins, metformin and potassium were causing her blood pressure to drop.  Dr. Lavonne Prairie felt that her blood pressure was well-controlled and as she was sensitive to medications did not feel there was room to adjust any medications at that time.  Given her reports of chest pain and shortness of breath a poet was recommended however was not completed.  Regarding her palpitations was noted she did have some SVT, recommended using her propranolol  5 to 10 mg as needed if she is particularly symptomatic.  Today she presents for follow-up.  She reports that she has been seen by multiple doctors in the 2 weeks, unfortunately we are unable to do these visits.  She reports that she had an MRI earlier this week for headaches and blurry vision that was unrevealing per patient.  Patient reports that yesterday she went to see her pain clinic doctor and her blood pressure was elevated to 160/93, recheck was not completed and she did not recheck her blood pressure at home.  Patient reports that she took her potassium which she feels helps to lower her blood pressure and took a nap and felt fine.  Patient reports that she has a history of blurry vision that has been intermittent for the last few years, again restarted earlier this week, has tried using eyedrops however this made her blurry vision worse, is planning to follow-up with  her optometrist.  Patient reports that her blood pressure seems to be fluctuating however she has not been regularly monitoring her blood pressure at home does not have a log for review.  Patient reports that if her blood pressure is less than 130/80 she has slight chest discomfort, she notes that this also occurs when her blood pressure is high.  She denies any increased shortness of breath,  lower extremity edema, orthopnea or PND.  She denies any presyncope or syncope.  ROS: .   Today she denies shortness of breath, lower extremity edema, fatigue, palpitations, melena, hematuria, hemoptysis, diaphoresis, weakness, presyncope, syncope, orthopnea, and PND.  All other systems are reviewed and otherwise negative. Studies Reviewed: Aaron Aas   EKG:  EKG is ordered today, personally reviewed, demonstrating  EKG Interpretation Date/Time:  Friday October 16 2023 13:26:46 EDT Ventricular Rate:  72 PR Interval:  114 QRS Duration:  80 QT Interval:  380 QTC Calculation: 416 R Axis:   63  Text Interpretation: Normal sinus rhythm Normal ECG When compared with ECG of 31-Mar-2023 13:28, No significant change was found Confirmed by Ramah Langhans [id number]) on 10/16/2023 1:43:47 PM   CV Studies: Cardiac studies reviewed are outlined and summarized above. Otherwise please see EMR for full report. Cardiac Studies & Procedures   ______________________________________________________________________________________________        Carles Cheadle  LONG TERM MONITOR (3-14 DAYS) 12/30/2021  Narrative Normal sinus rhythm is a predominant rhythm Occasional runs of supraventricular tachycardia with the longest being 8 beats.  There were 6 shorter runs than this. There was rare ventricular and supraventricular ectopy. No sustained arrhythmias.       ______________________________________________________________________________________________       Current Reported Medications:.    Current Meds  Medication Sig   ACCU-CHEK GUIDE TEST test strip 1 each by Other route as directed.   albuterol (VENTOLIN HFA) 108 (90 Base) MCG/ACT inhaler Inhale 1 puff into the lungs every 6 (six) hours as needed for wheezing or shortness of breath.   aspirin 81 MG EC tablet Take by mouth.   atorvastatin (LIPITOR) 40 MG tablet Take 40 mg by mouth daily.   Cholecalciferol (VITAMIN D3) 1.25 MG (50000 UT) CAPS Take 1 capsule by  mouth once a week.   cyanocobalamin (VITAMIN B12) 1000 MCG tablet Take 3,000 mcg by mouth in the morning and at bedtime.   cyclobenzaprine (FLEXERIL) 10 MG tablet cyclobenzaprine 10 mg tablet   dicyclomine (BENTYL) 20 MG tablet Take 20 mg by mouth 4 (four) times daily -  before meals and at bedtime.   fluticasone (FLONASE) 50 MCG/ACT nasal spray Place 2 sprays into both nostrils daily.   gabapentin (NEURONTIN) 600 MG tablet Take 600 mg by mouth 3 (three) times daily.   magnesium oxide (MAG-OX) 400 (240 Mg) MG tablet Take 400 mg by mouth daily.   meclizine (ANTIVERT) 25 MG tablet Take 25 mg by mouth 2 (two) times daily as needed.   metFORMIN (GLUCOPHAGE) 500 MG tablet Take by mouth at bedtime.   metoprolol tartrate (LOPRESSOR) 50 MG tablet Take 1 tablet (50 mg total) by mouth once for 1 dose.   montelukast (SINGULAIR) 10 MG tablet    omeprazole (PRILOSEC) 40 MG capsule Take 40 mg by mouth daily.   ondansetron (ZOFRAN) 4 MG tablet Take 4 mg by mouth 2 (two) times daily as needed for nausea or vomiting.   potassium chloride SA (KLOR-CON) 20 MEQ tablet potassium chloride ER 20 mEq tablet,extended release(part/cryst)   traMADol (ULTRAM) 50 MG tablet Take  50 mg by mouth every 6 (six) hours as needed.     Vitamin D , Ergocalciferol , (DRISDOL) 1.25 MG (50000 UNIT) CAPS capsule Take 50,000 Units by mouth every 7 (seven) days.   Wheat Dextrin (BENEFIBER DRINK MIX PO) Take by mouth as needed.   [DISCONTINUED] Cholecalciferol (DIALYVITE VITAMIN D  5000 PO) Take by mouth once a week.   Physical Exam:    VS:  BP 124/86   Pulse 63   Ht 5' (1.524 m)   Wt 180 lb 6.4 oz (81.8 kg)   SpO2 96%   BMI 35.23 kg/m    Wt Readings from Last 3 Encounters:  10/16/23 180 lb 6.4 oz (81.8 kg)  08/18/23 182 lb 9.6 oz (82.8 kg)  07/29/23 178 lb 6.4 oz (80.9 kg)    GEN: Well nourished, well developed in no acute distress NECK: No JVD; No carotid bruits CARDIAC: RRR, no murmurs, rubs, gallops RESPIRATORY:  Clear to  auscultation without rales, wheezing or rhonchi  ABDOMEN: Soft, non-tender, non-distended EXTREMITIES:  No edema; No acute deformity     Asessement and Plan:.    Precordial pain: Patient reports that when her blood pressure is less than 130/80 and when her blood pressure is high she has a slight chest discomfort, she feels that this improves after she takes her potassium.  POET has previously been ordered however patient reports that she is unable to walk on a treadmill and would prefer to not complete a stress test.  Discussed a coronary CTA which patient is in agreement to she will take metoprolol tartrate prior to testing, patient states she has previously tolerated metoprolol and is agreeable to the medication. Discussed use of nitroglycerin for test.  Reviewed ED precautions. Continue aspirin 81 mg daily, Lipitor 80 mg daily, propranolol  10 mg 3 times daily as needed.   HTN: Blood pressure today 124/86.  Patient reports fluctuations in her blood pressure however reports she is not regularly checking her blood pressure at home and does not have a log for review.  She reports that yesterday when she was at her pain management doctor's office her blood pressure was 160/93 and was not rechecked, she did not check it when she returned home, patient reports that prior to appointment she overall felt fine however after hearing that her blood pressure was elevated had some slight chest discomfort.  She notes that she also will feel unwell for blood pressures less than 130/80.  Patient takes propranolol  10 mg as needed for increased palpitations and sometimes will also take if her blood pressure becomes to high with improvement in symptoms. Recommended patient keep a blood pressure log at home for the next two weeks, discussed goal blood pressure would be less than 130/80.   Palpitations: Cardiac event monitor in 2023 was ordered which indicated normal sinus rhythm was her predominant rhythm, showed occasional  runs of supraventricular tachycardia with the longest being 8 beats, 6 shorter runs. There was rare ventricular and supraventricular ectopy.  With no sustained arrhythmias. Patient reports that her palpitations are stable with no significant change.   Blurry vision: Patient reports that she has a history of blurry vision that has been intermittent for the last few years. She notes this started again restarted earlier this week, has tried using eyedrops however this made her blurry vision worse. She has been following with her PCP for imaging, she is planning to follow-up with her optometrist.    Disposition: F/u with Akina Maish, NP in 6-8 weeks  Signed, Yancarlos Berthold D Bridgett Hattabaugh, NP

## 2023-10-14 NOTE — Telephone Encounter (Signed)
 Pt c/o BP issue: STAT if pt c/o blurred vision, one-sided weakness or slurred speech.  STAT if BP is GREATER than 180/120 TODAY.  STAT if BP is LESS than 90/60 and SYMPTOMATIC TODAY  1. What is your BP concern?  Pt states her BP has been up and down   2. Have you taken any BP medication today?  Yes (Propranolol  10mg  - she states she can't take once a day because it will drop BP too low. She states the bottle says to take 3x daily.)   3. What are your last 5 BP readings? Does not take regularly but states sometimes systolic ranges from 150-160 and then it will drop down to normal range.   4. Are you having any other symptoms (ex. Dizziness, headache, blurred vision, passed out)? Dizziness, SOB at times, and states she currently has a little blurred vision

## 2023-10-14 NOTE — Telephone Encounter (Signed)
 Pt was calling into the office as her PCP instructed to do at today's visit with the pt.   Pt states she saw her PCP today for ongoing complaints of dizziness, blurred vision, high/low BP readings, and pre-syncopal episodes.      Pt states this has been ongoing for a couple weeks now.  Pt states she saw her PCP today for evaluation of ongoing complaints of intermittent dizziness, blurred vision, mild sob, pre-syncopal episodes, and abnormal BP/HR readings at home.   Pt states she does periodically check her BP/HR at home, and her readings have been all over the place.  She states she is getting readings as high as 180/100 and as low as 90/60.  She states her rates usually stay between 50s-60s range.  She states she does monitor her blood sugars at home and they have been running around 100-110.  She states when her readings get low or high, she develops symptoms of dizziness, blurred vision, mild sob, and has felt pre-syncopal at times.  She states her PCP does have her on propranolol  10 mg TID, but she usually can only tolerate taking this once daily due to low BP readings.   Pt reports at her PCP visit today her BP was 130/80 and believes her rate was in the 50s-60s range.  She states her PCP did draw labs on her today, and advised for her to reach out to our office to obtain an appt for complaints.  When viewing her chart noted her PCP is not in epic and unable to pull records through care everywhere.  Did advise the pt to reach back out to the PCP and have them fax us  today's OV note along with her lab results when available, to [phone] ATTN: Dr. Lavonne Prairie.   Did make the pt a next available appt with our office for this Friday 6/6 at 1:55 pm with Katlyn West NP.  Advised her to arrive 20 mins prior to this visit.  Pt made aware of new office location/address and what floor to report to.   Did advise the pt to increase her po water intake, wear compressions during the day to promote  circulation and good BP support.  Did advise her to make sure she changes positions slowly, allowing 1-2 mins between position changes. Also advised her to continue monitoring her BP/HR at home, and hold her propranolol  dosing if BP/HR readings registering low.   Pt education provided about this.  Reiterated to her to have the PCP fax last OV note and lab results to our office.  ED precautions provided to the pt if symptoms return/worsen/persist between now and her appt with our office on Friday.  Pt education provided about acute cardiac complaints that warrant immediate notification to 911/ER.   Pt is aware that I will share this plan with Dr. Lavonne Prairie and his RN.   Pt verbalized understanding and agrees with this plan.

## 2023-10-16 ENCOUNTER — Encounter: Payer: Self-pay | Admitting: Cardiology

## 2023-10-16 ENCOUNTER — Ambulatory Visit: Attending: Cardiology | Admitting: Cardiology

## 2023-10-16 VITALS — BP 124/86 | HR 63 | Ht 60.0 in | Wt 180.4 lb

## 2023-10-16 DIAGNOSIS — R002 Palpitations: Secondary | ICD-10-CM

## 2023-10-16 DIAGNOSIS — I10 Essential (primary) hypertension: Secondary | ICD-10-CM | POA: Diagnosis not present

## 2023-10-16 DIAGNOSIS — R072 Precordial pain: Secondary | ICD-10-CM

## 2023-10-16 DIAGNOSIS — R42 Dizziness and giddiness: Secondary | ICD-10-CM

## 2023-10-16 MED ORDER — METOPROLOL TARTRATE 50 MG PO TABS: 50.0000 mg | ORAL_TABLET | Freq: Once | ORAL | 0 refills | Status: AC

## 2023-10-16 NOTE — Patient Instructions (Signed)
 Medication Instructions:  Take metoprolol tartrate 50 mg once 2 hours prior to scan *If you need a refill on your cardiac medications before your next appointment, please call your pharmacy*  Lab Work: Today we are going to draw a Bmet If you have labs (blood work) drawn today and your tests are completely normal, you will receive your results only by: MyChart Message (if you have MyChart) OR A paper copy in the mail If you have any lab test that is abnormal or we need to change your treatment, we will call you to review the results.  Testing/Procedures:   Your cardiac CT will be scheduled at one of the below locations:   [facility] [address] [place], [place] [address] [phone]  Or  [facility] and Vascular Tower [address]  [place], [place] [address] Opening September 07, 2023  If scheduled at [facility], please arrive at the [facility] and [facility] Entrance (Entrance C2) of [facility] 30 minutes prior to test start time. You can use the FREE valet parking offered at entrance C (encouraged to control the heart rate for the test)  Proceed to the [facility] Radiology Department (first floor) to check-in and test prep.   All radiology patients and guests should use entrance C2 at [facility], accessed from [facility], even though the hospital's physical address listed is [address].    If scheduled at the Heart and Vascular Tower at [address], please enter the parking lot using the [address] entrance and use the FREE valet service at the patient drop-off area. Enter the buidling and check-in with registration on the main floor.  Please follow these instructions carefully (unless otherwise directed):  An IV will be required for this test and Nitroglycerin will be given.   On the Night Before the Test: Be sure to Drink plenty of water. Do not consume any caffeinated/decaffeinated  beverages or chocolate 12 hours prior to your test. Do not take any antihistamines 12 hours prior to your test.  On the Day of the Test: Drink plenty of water until 1 hour prior to the test. Do not eat any food 1 hour prior to test. You may take your regular medications prior to the test.  Take metoprolol (Lopressor) two hours prior to test. If you take Furosemide/Hydrochlorothiazide/Spironolactone/Chlorthalidone, please HOLD on the morning of the test. Patients who wear a continuous glucose monitor MUST remove the device prior to scanning. FEMALES- please wear underwire-free bra if available, avoid dresses & tight clothing  After the Test: Drink plenty of water. After receiving IV contrast, you may experience a mild flushed feeling. This is normal. On occasion, you may experience a mild rash up to 24 hours after the test. This is not dangerous. If this occurs, you can take Benadryl 25 mg, Zyrtec, Claritin, or Allegra and increase your fluid intake. (Patients taking Tikosyn should avoid Benadryl, and may take Zyrtec, Claritin, or Allegra) If you experience trouble breathing, this can be serious. If it is severe call 911 IMMEDIATELY. If it is mild, please call our office.  We will call to schedule your test 2-4 weeks out understanding that some insurance companies will need an authorization prior to the service being performed.   For more information and frequently asked questions, please visit our website : [email or web address]  For non-scheduling related questions, please contact the cardiac imaging nurse navigator should you have any questions/concerns: Cardiac Imaging Nurse Navigators Direct  Office Dial: [phone]   For scheduling needs, including cancellations and rescheduling, please call [place], [phone].   Follow-Up: At [facility], you and your health needs are our priority.  As part of our continuing mission to provide you with exceptional heart  care, our providers are all part of one team.  This team includes your primary Cardiologist (physician) and Advanced Practice Providers or APPs (Physician Assistants and Nurse Practitioners) who all work together to provide you with the care you need, when you need it.  Your next appointment:   6-8 week(s)  Provider:   Katlyn West, NP   We recommend signing up for the patient portal called "MyChart".  Sign up information is provided on this After Visit Summary.  MyChart is used to connect with patients for Virtual Visits (Telemedicine).  Patients are able to view lab/test results, encounter notes, upcoming appointments, etc.  Non-urgent messages can be sent to your provider as well.   To learn more about what you can do with MyChart, go to [email or web address].   Other Instructions Please take your blood pressure daily for 2 weeks and send in a MyChart message. Please include heart rates. (One message at the end of the 2 weeks).   HOW TO TAKE YOUR BLOOD PRESSURE: Rest 5 minutes before taking your blood pressure. Don't smoke or drink caffeinated beverages for at least 30 minutes before. Take your blood pressure before (not after) you eat. Sit comfortably with your back supported and both feet on the floor (don't cross your legs). Elevate your arm to heart level on a table or a desk. Use the proper sized cuff. It should fit smoothly and snugly around your bare upper arm. There should be enough room to slip a fingertip under the cuff. The bottom edge of the cuff should be 1 inch above the crease of the elbow. Ideally, take 3 measurements at one sitting and record the average.

## 2023-10-18 ENCOUNTER — Ambulatory Visit: Payer: Self-pay | Admitting: Cardiology

## 2023-10-18 LAB — BASIC METABOLIC PANEL WITH GFR
BUN/Creatinine Ratio: 12 (ref 9–23)
BUN: 10 mg/dL (ref 6–24)
CO2: 25 mmol/L (ref 20–29)
Calcium: 10.4 mg/dL — ABNORMAL HIGH (ref 8.7–10.2)
Chloride: 95 mmol/L — ABNORMAL LOW (ref 96–106)
Creatinine, Ser: 0.82 mg/dL (ref 0.57–1.00)
Glucose: 95 mg/dL (ref 70–99)
Potassium: 4.3 mmol/L (ref 3.5–5.2)
Sodium: 141 mmol/L (ref 134–144)
eGFR: 85 mL/min/{1.73_m2} (ref >59)

## 2023-10-20 NOTE — Telephone Encounter (Signed)
 Left message to call back

## 2023-10-20 NOTE — Telephone Encounter (Signed)
-----   Message from Katlyn D West sent at 10/18/2023  9:20 PM EDT ----- Please let Ms. Pulcini know that her kidney function is normal and her electrolytes were overall normal. Good results, continue with testing as planned.

## 2023-10-21 NOTE — Telephone Encounter (Signed)
 Called patient advised of below they verbalized understanding.

## 2023-11-04 ENCOUNTER — Ambulatory Visit (HOSPITAL_COMMUNITY)

## 2023-11-26 ENCOUNTER — Ambulatory Visit: Admitting: Cardiology

## 2023-12-30 ENCOUNTER — Encounter (HOSPITAL_COMMUNITY): Payer: Self-pay

## 2024-01-02 ENCOUNTER — Other Ambulatory Visit: Payer: Self-pay | Admitting: Cardiology

## 2024-01-03 NOTE — Progress Notes (Deleted)
 Cardiology Office Note    Date:  01/03/2024  ID:  Danielle Contreras, Danielle Contreras, Danielle Contreras, MRN [id number] PCP:  Nestor Elston NOVAK, NP  Cardiologist:  Lynwood Schilling, MD  Electrophysiologist:  None   Chief Complaint: ***  History of Present Illness: Danielle Contreras is a 53 y.o. female with visit-pertinent history of anxiety, asthma, bipolar disorder, breast cancer, depression, fibromyalgia, gastritis, GERD, hyperlipidemia, IBS, lymphedema of the right arm, migraines, OSA and panic attacks.  Patient was seen in 09/2019 by Dr. Schilling for evaluation of hypertension.  He had previously seen her in 2012 and 2016 for past pain.  She did negative stress test in 2012.  In 2021 it was noted that patient had blood pressures at her PCPs office in the 170s over 100s and was started on metoprolol  and amlodipine .  At cardiology visit her blood pressure was well-controlled.  Patient noted to have sensitive to medications, at follow-up in 12/2020 she had stopped her amlodipine , was requested to restart amlodipine  2.5 mg at night and keep a blood pressure log, was noted that she had not been using her CPAP device.  On follow-up patient reported that when taking amlodipine  her blood pressure dropped to below and the medication was discontinued.  She was seen in clinic on 12/06/2021 by Josefa Beauvais, NP with reports of increased heart rate, dizziness and shortness of breath.  Patient noted she had been taking a quarter of a pill of metoprolol  which resulted in increased dizziness, she was started metoprolol  10 mg 3 times daily as needed.  A cardiac event monitor was ordered which indicated normal sinus rhythm was her predominant rhythm, showed occasional runs of supraventricular tachycardia with the longest being 8 beats, 6 shorter runs.  There was rare ventricular and supraventricular ectopy.  With no sustained arrhythmias.  Patient was seen in clinic by Dr. Schilling on 03/31/2023 for evaluation of  palpitations and fluctuating blood pressures and chest pain.  She described a sharp pinching chest discomfort that happened when her blood pressure or heart rate dropped.  She reported that her vitamins, metformin and potassium were causing her blood pressure to drop.  Dr. Schilling felt that her blood pressure was well-controlled and as she was sensitive to medications did not feel there was room to adjust any medications at that time.  Given her reports of chest pain and shortness of breath a poet was recommended however was not completed.  Regarding her palpitations was noted she did have some SVT, recommended using her propranolol  5 to 10 mg as needed if she is particularly symptomatic.  Patient was seen in clinic on 10/16/2023 for follow-up.  She reported that she been seen by multiple doctor last 2 weeks, unfortunately we were unable to view these visits.  She reported that she had an MRI earlier in the week for headaches and blurry vision.  She reported that she had seen her pain clinic doctor the day before and her blood pressure was elevated 260/93, recheck was not completed and she did not check her blood pressure at home.  Patient reported that she would take potassium which she felt would lower her blood pressure versus the.  She noted a history of blurry vision that been intermittent for the last few years.  She reported that her blood pressure had been fluctuating however was not monitoring at home.  She reported that if her blood pressure was less than 130/80 she had chest discomfort.  A coronary  CTA was ordered, on review does not appear to have been completed.  Today she presents for follow-up.  She reports that she  Precordial pain HTN:  Palpitations:  Blurry vision:    Labwork independently reviewed:   ROS: .   *** denies chest pain, shortness of breath, lower extremity edema, fatigue, palpitations, melena, hematuria, hemoptysis, diaphoresis, weakness, presyncope, syncope, orthopnea,  and PND.  All other systems are reviewed and otherwise negative.  Studies Reviewed: Danielle    EKG:  EKG is ordered today, personally reviewed, demonstrating ***     CV Studies: Cardiac studies reviewed are outlined and summarized above. Otherwise please see EMR for full report. Cardiac Studies & Procedures   ______________________________________________________________________________________________        SHERRILEE  LONG TERM MONITOR (3-14 DAYS) 12/30/2021  Narrative Normal sinus rhythm is a predominant rhythm Occasional runs of supraventricular tachycardia with the longest being 8 beats.  There were 6 shorter runs than this. There was rare ventricular and supraventricular ectopy. No sustained arrhythmias.       ______________________________________________________________________________________________       Current Reported Medications:.    No outpatient medications have been marked as taking for the 01/06/24 encounter (Appointment) with Arlen Legendre D, NP.    Physical Exam:    VS:  There were no vitals taken for this visit.   Wt Readings from Last 3 Encounters:  10/16/23 180 lb 6.4 oz (81.8 kg)  08/18/23 182 lb 9.6 oz (82.8 kg)  07/29/23 178 lb 6.4 oz (80.9 kg)    GEN: Well nourished, well developed in no acute distress NECK: No JVD; No carotid bruits CARDIAC: ***RRR, no murmurs, rubs, gallops RESPIRATORY:  Clear to auscultation without rales, wheezing or rhonchi  ABDOMEN: Soft, non-tender, non-distended EXTREMITIES:  No edema; No acute deformity     Asessement and Plan:.     ***     Disposition: F/u with ***  Signed, Amalie Koran D Shanaye Rief, NP

## 2024-01-06 ENCOUNTER — Ambulatory Visit: Admitting: Cardiology

## 2024-01-12 ENCOUNTER — Telehealth (HOSPITAL_COMMUNITY): Payer: Self-pay | Admitting: Emergency Medicine

## 2024-01-12 NOTE — Telephone Encounter (Signed)
 Reaching out to patient to offer assistance regarding upcoming cardiac imaging study; pt verbalizes understanding of appt date/time, parking situation and where to check in, pre-test NPO status and medications ordered, and verified current allergies; name and call back number provided for further questions should they arise Rockwell Alexandria RN Navigator Cardiac Imaging [facility] [phone] office [phone] cell

## 2024-01-13 ENCOUNTER — Other Ambulatory Visit: Payer: Self-pay | Admitting: Cardiology

## 2024-01-13 ENCOUNTER — Ambulatory Visit (HOSPITAL_BASED_OUTPATIENT_CLINIC_OR_DEPARTMENT_OTHER)
Admission: RE | Admit: 2024-01-13 | Discharge: 2024-01-13 | Disposition: A | Discharge status: Home / Self Care | Source: Ambulatory Visit | Attending: Cardiology | Admitting: Cardiology

## 2024-01-13 ENCOUNTER — Ambulatory Visit (HOSPITAL_COMMUNITY)
Admission: RE | Admit: 2024-01-13 | Discharge: 2024-01-13 | Disposition: A | Discharge status: Home / Self Care | Source: Ambulatory Visit | Attending: Cardiology | Admitting: Cardiology

## 2024-01-13 DIAGNOSIS — R002 Palpitations: Secondary | ICD-10-CM | POA: Diagnosis present

## 2024-01-13 DIAGNOSIS — R931 Abnormal findings on diagnostic imaging of heart and coronary circulation: Secondary | ICD-10-CM | POA: Diagnosis present

## 2024-01-13 DIAGNOSIS — I10 Essential (primary) hypertension: Secondary | ICD-10-CM | POA: Insufficient documentation

## 2024-01-13 DIAGNOSIS — I2510 Atherosclerotic heart disease of native coronary artery without angina pectoris: Secondary | ICD-10-CM | POA: Diagnosis not present

## 2024-01-13 MED ORDER — IOHEXOL 350 MG/ML SOLN
100.0000 mL | Freq: Once | INTRAVENOUS | Status: AC | PRN
  Administered 2024-01-13: 100 mL via INTRAVENOUS

## 2024-01-13 MED ORDER — NITROGLYCERIN 0.4 MG SL SUBL
0.8000 mg | SUBLINGUAL_TABLET | Freq: Once | SUBLINGUAL | Status: AC
  Administered 2024-01-13: 0.8 mg via SUBLINGUAL

## 2024-01-26 NOTE — Telephone Encounter (Signed)
-----   Message from Nurse Lonni HERO sent at 01/25/2024  3:33 PM EDT -----  ----- Message ----- From: West, Katlyn D, NP Sent: 01/15/2024   4:49 PM EDT To: Leontine GORMAN Fess, CMA  Please let Ms. Delpino know that her coronary CTA showed no evidence of hardened plaque in the coronary arteries, there was evidence of some mild soft plaque, this is not decreasing blood flow to  the heart. Overall reassuring result! Continue statin medication and follow up with Lum Louis, NP on 02/09/24 as planned.  ----- Message ----- From: Interface, Rad Results In Sent: 01/13/2024   3:18 PM EDT To: Katlyn D West, NP

## 2024-01-26 NOTE — Telephone Encounter (Signed)
 3rd documented cal to deliver CTA results. No answer, voicemail is full.

## 2024-02-09 ENCOUNTER — Ambulatory Visit: Admitting: Emergency Medicine

## 2024-02-21 NOTE — Progress Notes (Deleted)
  Cardiology Office Note:   Date:  02/21/2024  ID:  Danielle Contreras, DOB Jun 25, 1970, MRN [id number] PCP: Patient, No Pcp Per   HeartCare Providers Cardiologist:  Lynwood Schilling, MD {  History of Present Illness:   Danielle Contreras Danielle Contreras is a 53 y.o. female  is a 53 y.o. female who presents for evaluation of HTN. I saw her in 2012 and 2016 for chest pain.   I saw her last in Sept 2023 for management of HTN.  She had a negative stress test in 2012.  She had difficult to control HTN.       She presents for evaluation of palpitations and fluctuating blood pressures and chest pain.  She describes a sharp pinching chest discomfort that happens when her blood pressure drops her heart rate drops.  She says that she really does not tolerate taking medications.  She thinks taking her vitamin, metformin and potassium all caused her blood pressure to drop.  She does not like to take those together.  She does not want to take her propranolol  frequently because her blood pressure will drop.  She says she does not feel good when her blood pressure is in the systolic 120s over the diastolics in the 70s.  She does not feel good going her heart rates in the 60s.  She has not had any frank syncope or presyncope.  She is not describing PND or orthopnea.  ROS: ***  Studies Reviewed:    EKG:       ***  Risk Assessment/Calculations:   {Does this patient have ATRIAL FIBRILLATION?[phone]} No BP recorded.  {Refresh Note OR Click here to enter BP  :1}***        Physical Exam:   VS:  There were no vitals taken for this visit.   Wt Readings from Last 3 Encounters:  10/16/23 180 lb 6.4 oz (81.8 kg)  08/18/23 182 lb 9.6 oz (82.8 kg)  07/29/23 178 lb 6.4 oz (80.9 kg)     GEN: Well nourished, well developed in no acute distress NECK: No JVD; No carotid bruits CARDIAC: ***RR, *** murmurs, rubs, gallops RESPIRATORY:  Clear to auscultation without rales, wheezing or rhonchi  ABDOMEN:  Soft, non-tender, non-distended EXTREMITIES:  No edema; No deformity   ASSESSMENT AND PLAN:   HTN:    Her blood pressure is ***  controlled.   She is very sensitive to any medications so not going to try to adjust these.  I tried to explain that her blood pressures well-controlled.  She wanted something just to treat the systolic blood pressures and not effective diastolic and I said this probably would not be possible.  Looking at her blood pressure diary I think she does not need any change in therapy.   Palpitations:  ***  She does have some SVT and I think she could use her propranolol  5 to 10 mg as needed if she is particularly symptomatic.     Follow up ***  Signed, Lynwood Schilling, MD

## 2024-02-24 ENCOUNTER — Ambulatory Visit: Admitting: Cardiology

## 2024-02-24 DIAGNOSIS — R002 Palpitations: Secondary | ICD-10-CM

## 2024-02-24 DIAGNOSIS — I10 Essential (primary) hypertension: Secondary | ICD-10-CM

## 2024-03-21 NOTE — Progress Notes (Unsigned)
  Cardiology Office Note:   Date:  03/23/2024  ID:  Danielle Contreras, DOB 03-01-1971, MRN [id number] PCP: Patient, No Pcp Per  [facility] Providers Cardiologist:  Lynwood Schilling, MD {  History of Present Illness:   Danielle Contreras is a 53 y.o. female  is a 53 y.o. female who presents for evaluation of HTN. I saw her in 2012 and 2016 for chest pain.   I saw her last in Sept 2023 for management of HTN.  She had a negative stress test in 2012.  She had difficult to control HTN.       She presented for evaluation of palpitations and fluctuating blood pressures and chest pain.  She had stinging pain she says when her heart rate goes under her 70s.  She had a CT that I reviewed with her that demonstrated nonobstructive less than 50% plaque in the RCA.  She is have blood pressures that have been somewhat fluctuating but seem to be fairly well-controlled with perhaps lower diastolics occasionally.  She has not had any new presyncope or syncope.  She has had no new chest pressure, neck or arm discomfort.  She has had no weight gain or edema.  ROS: As stated in the HPI and negative for all other systems.  Studies Reviewed:    EKG:     NA  Risk Assessment/Calculations:             Physical Exam:   VS:  BP 120/70   Pulse 75   Ht 5' (1.524 m)   Wt 194 lb 9.6 oz (88.3 kg)   SpO2 95%   BMI 38.01 kg/m    Wt Readings from Last 3 Encounters:  03/23/24 194 lb 9.6 oz (88.3 kg)  10/16/23 180 lb 6.4 oz (81.8 kg)  08/18/23 182 lb 9.6 oz (82.8 kg)     GEN: Well nourished, well developed in no acute distress NECK: No JVD; No carotid bruits CARDIAC: RRR, no murmurs, rubs, gallops RESPIRATORY:  Clear to auscultation without rales, wheezing or rhonchi  ABDOMEN: Soft, non-tender, non-distended EXTREMITIES:  No edema; No deformity   ASSESSMENT AND PLAN:   HTN:    Her blood pressure is at target.  No change in therapy.  We talked about how her diastolics might be  somewhat low but since her systolics are well-controlled I would not suggest a change to her medications.   Palpitations: She is sensitive to a lot of medications.  I will not make any change to her therapy.  She has had no significant dysrhythmias noted.  CAD: She has nonobstructive coronary disease and we had a long discussion about this.  She is going to get her cholesterol checked and have those results sent to me.  I would suggest a goal LDL should be in the 70s given early plaque she has in the RCA.     Follow up APP in about 12 months.  Signed, Lynwood Schilling, MD

## 2024-03-23 ENCOUNTER — Encounter: Payer: Self-pay | Admitting: Cardiology

## 2024-03-23 ENCOUNTER — Ambulatory Visit: Attending: Cardiology | Admitting: Cardiology

## 2024-03-23 VITALS — BP 120/70 | HR 75 | Ht 60.0 in | Wt 194.6 lb

## 2024-03-23 DIAGNOSIS — I10 Essential (primary) hypertension: Secondary | ICD-10-CM | POA: Diagnosis not present

## 2024-03-23 DIAGNOSIS — R002 Palpitations: Secondary | ICD-10-CM

## 2024-03-23 NOTE — Patient Instructions (Signed)
 Medication Instructions:  Your physician recommends that you continue on your current medications as directed. Please refer to the Current Medication list given to you today.  *If you need a refill on your cardiac medications before your next appointment, please call your pharmacy*  Lab Work: None ordered.  If you have labs (blood work) drawn today and your tests are completely normal, you will receive your results only by: MyChart Message (if you have MyChart) OR A paper copy in the mail If you have any lab test that is abnormal or we need to change your treatment, we will call you to review the results.  Testing/Procedures: None ordered.   Follow-Up: At [facility], you and your health needs are our priority.  As part of our continuing mission to provide you with exceptional heart care, our providers are all part of one team.  This team includes your primary Cardiologist (physician) and Advanced Practice Providers or APPs (Physician Assistants and Nurse Practitioners) who all work together to provide you with the care you need, when you need it.  Your next appointment:   12 months with Dr Denver APP

## 2024-07-01 ENCOUNTER — Ambulatory Visit (HOSPITAL_BASED_OUTPATIENT_CLINIC_OR_DEPARTMENT_OTHER): Admitting: Radiology

## 2024-07-05 ENCOUNTER — Inpatient Hospital Stay

## 2024-07-05 ENCOUNTER — Inpatient Hospital Stay: Admitting: Oncology

## 2024-07-06 ENCOUNTER — Other Ambulatory Visit

## 2024-07-06 ENCOUNTER — Ambulatory Visit: Admitting: Oncology
